# Patient Record
Sex: Male | Born: 1935
Health system: Southern US, Community
[De-identification: ages and names within clinical notes are randomized; demographics above are authoritative.]

## PROBLEM LIST (undated history)

## (undated) DIAGNOSIS — Z952 Presence of prosthetic heart valve: Secondary | ICD-10-CM

## (undated) DIAGNOSIS — M519 Unspecified thoracic, thoracolumbar and lumbosacral intervertebral disc disorder: Secondary | ICD-10-CM

## (undated) DIAGNOSIS — R011 Cardiac murmur, unspecified: Secondary | ICD-10-CM

## (undated) DIAGNOSIS — K219 Gastro-esophageal reflux disease without esophagitis: Secondary | ICD-10-CM

## (undated) DIAGNOSIS — Z8739 Personal history of other diseases of the musculoskeletal system and connective tissue: Secondary | ICD-10-CM

## (undated) DIAGNOSIS — E782 Mixed hyperlipidemia: Secondary | ICD-10-CM

## (undated) DIAGNOSIS — I10 Essential (primary) hypertension: Secondary | ICD-10-CM

## (undated) DIAGNOSIS — I4891 Unspecified atrial fibrillation: Secondary | ICD-10-CM

## (undated) DIAGNOSIS — I350 Nonrheumatic aortic (valve) stenosis: Secondary | ICD-10-CM

## (undated) DIAGNOSIS — I499 Cardiac arrhythmia, unspecified: Secondary | ICD-10-CM

## (undated) DIAGNOSIS — I2510 Atherosclerotic heart disease of native coronary artery without angina pectoris: Secondary | ICD-10-CM

## (undated) DIAGNOSIS — Z87442 Personal history of urinary calculi: Secondary | ICD-10-CM

## (undated) DIAGNOSIS — N400 Enlarged prostate without lower urinary tract symptoms: Secondary | ICD-10-CM

## (undated) HISTORY — DX: Unspecified atrial fibrillation: I48.91

## (undated) HISTORY — DX: Personal history of other diseases of the musculoskeletal system and connective tissue: Z87.39

## (undated) HISTORY — PX: TOTAL KNEE ARTHROPLASTY: SHX125

## (undated) HISTORY — PX: LUMBAR LAMINECTOMY: SHX95

## (undated) HISTORY — DX: Benign prostatic hyperplasia without lower urinary tract symptoms: N40.0

## (undated) HISTORY — DX: Gastro-esophageal reflux disease without esophagitis: K21.9

## (undated) HISTORY — DX: Mixed hyperlipidemia: E78.2

## (undated) HISTORY — DX: Unspecified thoracic, thoracolumbar and lumbosacral intervertebral disc disorder: M51.9

---

## 1998-05-26 ENCOUNTER — Ambulatory Visit (HOSPITAL_COMMUNITY): Admission: RE | Admit: 1998-05-26 | Discharge: 1998-05-26 | Payer: Self-pay | Admitting: *Deleted

## 2000-08-09 ENCOUNTER — Encounter: Payer: Self-pay | Admitting: *Deleted

## 2000-08-09 ENCOUNTER — Ambulatory Visit (HOSPITAL_COMMUNITY): Admission: RE | Admit: 2000-08-09 | Discharge: 2000-08-09 | Payer: Self-pay | Admitting: *Deleted

## 2000-10-31 ENCOUNTER — Ambulatory Visit (HOSPITAL_COMMUNITY): Admission: RE | Admit: 2000-10-31 | Discharge: 2000-10-31 | Payer: Self-pay | Admitting: *Deleted

## 2000-10-31 ENCOUNTER — Encounter: Payer: Self-pay | Admitting: *Deleted

## 2001-12-29 ENCOUNTER — Ambulatory Visit (HOSPITAL_COMMUNITY): Admission: RE | Admit: 2001-12-29 | Discharge: 2001-12-29 | Payer: Self-pay | Admitting: Neurological Surgery

## 2001-12-29 ENCOUNTER — Encounter: Payer: Self-pay | Admitting: Neurological Surgery

## 2002-10-31 ENCOUNTER — Encounter: Payer: Self-pay | Admitting: Urology

## 2002-10-31 ENCOUNTER — Encounter: Admission: RE | Admit: 2002-10-31 | Discharge: 2002-10-31 | Payer: Self-pay | Admitting: Urology

## 2002-11-05 ENCOUNTER — Encounter: Payer: Self-pay | Admitting: Urology

## 2002-11-05 ENCOUNTER — Ambulatory Visit (HOSPITAL_BASED_OUTPATIENT_CLINIC_OR_DEPARTMENT_OTHER): Admission: RE | Admit: 2002-11-05 | Discharge: 2002-11-05 | Payer: Self-pay | Admitting: Urology

## 2005-06-18 ENCOUNTER — Ambulatory Visit (HOSPITAL_COMMUNITY): Admission: RE | Admit: 2005-06-18 | Discharge: 2005-06-18 | Payer: Self-pay | Admitting: Urology

## 2005-09-17 ENCOUNTER — Encounter
Admission: RE | Admit: 2005-09-17 | Discharge: 2005-09-17 | Payer: Self-pay | Admitting: Physical Medicine and Rehabilitation

## 2005-09-20 ENCOUNTER — Encounter: Admission: RE | Admit: 2005-09-20 | Discharge: 2005-11-23 | Payer: Self-pay | Admitting: Family Medicine

## 2005-12-15 ENCOUNTER — Encounter: Admission: RE | Admit: 2005-12-15 | Discharge: 2005-12-15 | Payer: Self-pay | Admitting: Otolaryngology

## 2005-12-28 ENCOUNTER — Encounter (INDEPENDENT_AMBULATORY_CARE_PROVIDER_SITE_OTHER): Payer: Self-pay | Admitting: *Deleted

## 2005-12-28 ENCOUNTER — Ambulatory Visit (HOSPITAL_COMMUNITY): Admission: RE | Admit: 2005-12-28 | Discharge: 2005-12-28 | Payer: Self-pay | Admitting: Otolaryngology

## 2006-01-24 ENCOUNTER — Encounter: Admission: RE | Admit: 2006-01-24 | Discharge: 2006-03-02 | Payer: Self-pay | Admitting: Family Medicine

## 2006-03-23 ENCOUNTER — Inpatient Hospital Stay (HOSPITAL_BASED_OUTPATIENT_CLINIC_OR_DEPARTMENT_OTHER): Admission: RE | Admit: 2006-03-23 | Discharge: 2006-03-23 | Payer: Self-pay | Admitting: Cardiology

## 2006-11-28 ENCOUNTER — Inpatient Hospital Stay (HOSPITAL_COMMUNITY): Admission: RE | Admit: 2006-11-28 | Discharge: 2006-12-02 | Payer: Self-pay | Admitting: Orthopedic Surgery

## 2006-12-07 ENCOUNTER — Encounter: Admission: RE | Admit: 2006-12-07 | Discharge: 2006-12-07 | Payer: Self-pay | Admitting: Cardiology

## 2007-01-23 ENCOUNTER — Encounter: Admission: RE | Admit: 2007-01-23 | Discharge: 2007-02-21 | Payer: Self-pay | Admitting: Orthopedic Surgery

## 2007-01-30 ENCOUNTER — Encounter: Admission: RE | Admit: 2007-01-30 | Discharge: 2007-02-27 | Payer: Self-pay | Admitting: Family Medicine

## 2007-02-28 ENCOUNTER — Encounter: Admission: RE | Admit: 2007-02-28 | Discharge: 2007-03-21 | Payer: Self-pay | Admitting: Family Medicine

## 2007-06-07 ENCOUNTER — Encounter: Admission: RE | Admit: 2007-06-07 | Discharge: 2007-07-06 | Payer: Self-pay | Admitting: Orthopedic Surgery

## 2007-07-07 ENCOUNTER — Encounter: Admission: RE | Admit: 2007-07-07 | Discharge: 2007-08-02 | Payer: Self-pay | Admitting: Orthopedic Surgery

## 2007-08-03 ENCOUNTER — Encounter: Admission: RE | Admit: 2007-08-03 | Discharge: 2007-09-07 | Payer: Self-pay | Admitting: Orthopedic Surgery

## 2007-10-19 HISTORY — PX: EYE SURGERY: SHX253

## 2007-10-19 HISTORY — PX: CERVICAL LAMINECTOMY: SHX94

## 2007-11-13 ENCOUNTER — Inpatient Hospital Stay (HOSPITAL_COMMUNITY): Admission: RE | Admit: 2007-11-13 | Discharge: 2007-11-17 | Payer: Self-pay | Admitting: Orthopedic Surgery

## 2007-12-12 ENCOUNTER — Encounter: Admission: RE | Admit: 2007-12-12 | Discharge: 2008-01-16 | Payer: Self-pay | Admitting: Orthopedic Surgery

## 2008-01-17 ENCOUNTER — Encounter: Admission: RE | Admit: 2008-01-17 | Discharge: 2008-01-29 | Payer: Self-pay | Admitting: Orthopedic Surgery

## 2008-05-22 ENCOUNTER — Ambulatory Visit (HOSPITAL_COMMUNITY): Admission: RE | Admit: 2008-05-22 | Discharge: 2008-05-23 | Payer: Self-pay | Admitting: Neurological Surgery

## 2008-06-18 ENCOUNTER — Encounter: Admission: RE | Admit: 2008-06-18 | Discharge: 2008-06-18 | Payer: Self-pay | Admitting: Neurological Surgery

## 2008-07-17 ENCOUNTER — Ambulatory Visit (HOSPITAL_COMMUNITY): Admission: RE | Admit: 2008-07-17 | Discharge: 2008-07-17 | Payer: Self-pay | Admitting: Orthopedic Surgery

## 2008-07-30 ENCOUNTER — Encounter: Admission: RE | Admit: 2008-07-30 | Discharge: 2008-10-03 | Payer: Self-pay | Admitting: Orthopedic Surgery

## 2008-08-26 ENCOUNTER — Encounter: Admission: RE | Admit: 2008-08-26 | Discharge: 2008-08-26 | Payer: Self-pay | Admitting: Neurological Surgery

## 2008-11-13 ENCOUNTER — Encounter: Admission: RE | Admit: 2008-11-13 | Discharge: 2008-12-04 | Payer: Self-pay | Admitting: Orthopedic Surgery

## 2008-11-15 ENCOUNTER — Encounter: Admission: RE | Admit: 2008-11-15 | Discharge: 2008-11-15 | Payer: Self-pay | Admitting: Family Medicine

## 2008-12-03 ENCOUNTER — Encounter: Admission: RE | Admit: 2008-12-03 | Discharge: 2008-12-03 | Payer: Self-pay | Admitting: Neurological Surgery

## 2008-12-16 ENCOUNTER — Encounter: Admission: RE | Admit: 2008-12-16 | Discharge: 2009-02-10 | Payer: Self-pay | Admitting: Orthopedic Surgery

## 2009-10-18 HISTORY — PX: SHOULDER SURGERY: SHX246

## 2010-06-11 ENCOUNTER — Ambulatory Visit: Payer: Self-pay | Admitting: Cardiology

## 2010-06-19 ENCOUNTER — Ambulatory Visit (HOSPITAL_COMMUNITY): Admission: RE | Admit: 2010-06-19 | Discharge: 2010-06-19 | Payer: Self-pay | Admitting: Cardiology

## 2010-06-19 ENCOUNTER — Ambulatory Visit: Payer: Self-pay | Admitting: Cardiology

## 2010-06-26 ENCOUNTER — Ambulatory Visit: Payer: Self-pay | Admitting: Cardiology

## 2010-07-03 ENCOUNTER — Ambulatory Visit: Payer: Self-pay | Admitting: Cardiology

## 2010-07-23 ENCOUNTER — Ambulatory Visit: Payer: Self-pay | Admitting: Cardiology

## 2010-08-06 ENCOUNTER — Ambulatory Visit: Payer: Self-pay | Admitting: Cardiology

## 2010-11-08 ENCOUNTER — Encounter: Payer: Self-pay | Admitting: Urology

## 2011-03-02 NOTE — Op Note (Signed)
NAMECHARLES, [name]                ACCOUNT NO.:  [id number]   MEDICAL RECORD NO.:  [id number]          PATIENT TYPE:  OIB   LOCATION:  3533                         FACILITY:  [facility]   PHYSICIAN:  Tia Alert, MD     DATE OF BIRTH:  30-Jan-1926   DATE OF PROCEDURE:  05/22/2008  DATE OF DISCHARGE:                               OPERATIVE REPORT   PREOPERATIVE DIAGNOSIS:  Cervical spondylosis with cervical spinal  stenosis, C5-C6, C6-C7 with cord compression.   POSTOPERATIVE DIAGNOSIS:  Cervical spondylosis with cervical spinal  stenosis, C5-C6, C6-C7 with cord compression.   PROCEDURES:  1. Decompressive anterior cervical diskectomy, C5-C6, C6-C7 for      central canal decompression.  2. Anterior cervical arthrodesis, C5-C6, C6-C7 utilizing a 6-mm      corticocancellous allograft at C5-C6 and a 7-mm graft at C6-C7.  3. Anterior cervical plating, C5-C7 inclusive utilizing a 40-mm      Atlantis Venture plate.   SURGEON:  Tia Alert, MD   ASSISTANT:  Donalee Citrin, MD   ANESTHESIA:  General endotracheal.   COMPLICATIONS:  None apparent.   INDICATIONS FOR PROCEDURE:  John [name] is an 77 year old gentleman who was  referred with neck pain with numbness in his hand and numbness down his  right arm.  He had an MRI which showed cervical spondylosis at C3-C4, C4-  C5, C5-C6 and C6-C7.  C3-C4 had anterior subluxation of C3 on C4.  C4-C5  had some subluxation of C4 on C5. They both had degenerative disk  disease and facet arthrosis.  However, at C5-C6 and C6-C7, he had large  osteophytic ridges with disk bulges which caused significant spinal  stenosis and severe cord compression at C5-C6 and laterally severe  compression at C6-C7.  There was no cord compression at C3-C4 and C4-C5.  Because of his advanced age, I recommended 2-level anterior cervical  diskectomy and fusion plating at C5-C6 and C6-C7 trying to avoid the  bigger 4-level ACDF replating or the posterior cervical  decompression  instrumented fusion.  This would serve the purpose of decompressing his  spinal cord and hopefully prevent worsening of myelopathy.  He  understood the risks, benefits, expected outcome, and wished to proceed.   DESCRIPTION OF PROCEDURE:  The patient was taken to the operating room.  After induction of adequate generalized endotracheal anesthesia, he was  placed in supine position on the operating room table.  His right  anterior cervical region was prepped with DuraPrep and then draped in  usual sterile fashion.  Local anesthesia 3 mL was injected and a  transverse incision was made to the right of midline and carried down to  the platysma which was elevated, opened, and undermined with Metzenbaum  scissors.  I then dissected a plane medial to the sternocleidomastoid  muscle and internal carotid artery and lateral to the trachea and  esophagus to expose C5-C6 and C6-C7.  The intraoperative fluoroscopy  confirmed my level and then I bit the anterior osteophytes with the  Leksell rongeur to flatten the anterior cervical spine.  I then used the  high-speed  drill to drill the endplates at C5-C6 and C6-C7, both disk  spaces were collapsed to about a millimeter or two.  I drilled to a  height of 6 mm at C5-C6 and 7 mm at C6-C7.  I drilled down to the level  of the posterior longitudinal ligament and brought in the operating  microscope.  I used the 1-mm Kerrison punch to get up under the ligament  with a nerve hook and then remove it in a circumferential fashion  undercutting the bodies of C5 and C6.  Angling the scope up and down to  look up under the bodies as best we could, we dissected and decompressed  along the superior endplate of C6 to identify the pedicles bilaterally  and then performed foraminotomies.  The dura was pushed away.  There was  severe spondylosis and overgrown facet and spur complexes.  The spur at  C5-C6 was quite large and quite compressive.  This was  bitten away and  then removed.  We then palpated in a circumferential fashion with a  nerve hook to assure adequate decompression.  The dura was full and  capacious all the way across.  We palpated it in circumferential fashion  to assure adequate decompression with both palpation and visualization.  We felt like we had a good decompression at C5-C6.  We inspected the  films once again and made sure that we had achieved what we felt we  should achieve at C5-C6 and then moved to C6-C7 and performed the exact  same decompression opening the posterior longitudinal ligament with a  nerve hook and then removing it in a circumferential fashion with  undercutting the bodies of C6-C7.  We marched along the C7 endplate to  identify the pedicles and decompressed these into the foramina until the  C7 nerve roots were decompressed.  Again, the dura was full and  capacious and we palpated with a nerve hook in a circumferential fashion  to assure adequate decompression.  Once the decompression was completed  at both levels, we measured the interspace to be 6 mm at C5-C6 and 7 mm  at C6-C7.  We used corresponding allografts and tapped these into  position.  We then used a 40-mm Atlantis Venture plate and placed two 15-  mm variable angle screw in the bodies of C5-C6 and C6-C7 and these were  locked into the plate by locking mechanism within the plate.  We then  spent considerable time drying the surgical bed with bipolar cautery,  surgery foam, and then irrigated all of this clear.  Once meticulous  hemostasis was achieved, closed the platysma with 3-0 Vicryl, closed  subcuticular tissue with 3-0 Vicryl, and closed the skin with Benzoin  and Steri-Strips.  The drapes were removed.  A sterile dressing was  applied.  The patient was awakened from general anesthesia and  transferred to recovery room in stable condition.  At the end of the  procedure all sponge, needle, and instrument counts were  correct.      Tia Alert, MD  Electronically Signed     DSJ/MEDQ  D:  05/22/2008  T:  05/23/2008  Job:  [id number]

## 2011-03-02 NOTE — Discharge Summary (Signed)
John John [name], John John [name]                ACCOUNT NO.:  [id number]   MEDICAL RECORD NO.:  [id number]          PATIENT TYPE:  INP   LOCATION:  5023                         FACILITY:  [facility]   PHYSICIAN:  Mila Homer. Sherlean John [name], M.D. DATE OF BIRTH:  1926-05-26   DATE OF ADMISSION:  11/13/2007  DATE OF DISCHARGE:  11/17/2007                               DISCHARGE SUMMARY   ADMISSION DIAGNOSES:  1. End-stage osteoarthritis left knee status post right knee      replacement.  2. Aortic stenosis.  3. Left John [name] drop status post lumbar surgery.  4. History of kidney stones.   DISCHARGE DIAGNOSES:  1. End-stage osteoarthritis left knee status post left total knee      arthroplasty.  2. Acute blood loss anemia secondary to surgery.  3. Hyponatremia.  4. Possible hematemesis.  5. Hypertension.  6. Aortic stenosis.  7. History of left John [name] drop status post lumbar surgery.  8. History of kidney stones.   SURGICAL PROCEDURES:  On November 13, 2007, John John [name] underwent a left  total knee arthroplasty by Dr. Mila Homer. Lucey assisted by Jacqualine Code, P.A.-C.  He had a NexGen all poly patella size 35 9-mm  thickness placed with a NexGen fluted stem tibial component size 6.  A  NexGen Legacy LPS articular surface size green EF 14 mm height with a  NexGen Legacy knee LPS femoral component size F left.   COMPLICATIONS:  None.   CONSULTANT:  1. Physical therapy case management consult, November 14, 2007.  2. Cardiology consult by Dr. Deborah Chalk, November 14, 2007.   HISTORY OF PRESENT ILLNESS:  This 77 year old white male patient  presented to [name] with a history of a right knee replacement in  February 2008 that has done very well.  He has had a 3-year history of  gradual onset progressive left knee pain.  Pain is an intermittent sharp  ache over the anterior joint without radiation.  It increases with  activity and decreases with rest.  The knee grinds.  He has failed  conservative treatment.  X-rays  show end-stage arthritic changes.  Because of this he is presenting for a left knee replacement.   The patient underwent surgical procedure well without immediate  postoperative complications.  He was transferred to 5000.  On postop day  #1 he did have some nausea and vomiting, it was a bit coffee-ground.  Hemoglobin was 8.8, hematocrit 25.1, BP was low at 98/54.  He was  started on IV Protonix.  Cardiac workup was started.  Celebrex was  discontinued and Dr. Deborah Chalk was consulted to follow him and he followed  him the rest of his hospitalization.   On postop day #2, nausea had resolved.  There was no more hematemesis.  Hemoglobin was 8.8, hematocrit 25.7, BP still low at 98/50.  Hemovac and  Marcaine pump were discontinued.  Dressing was changed to the knee.  He  was transfused 1 unit of packed red blood cells.  He was switched to  p.o. pain meds and continued on therapy.   On postop day #3 he felt  pretty good.  Dizziness had resolved.  Hemoglobin was 8.3, hematocrit 24.1, BP improved to 126/71.  Leg was  neurovascularly intact.  His hyponatremia had resolved.  He was  continued on therapy and it was felt he would be ready for discharge  home in the next day or so.   On January 30th, hemoglobin was 9.4, hematocrit 27.  He had received a  transfusion the day before.  BP was 110/58.  Stool was guaiac negative.  He was doing well enough with therapy that it was felt he was ready for  discharge home.  Cardiology agreed and he was discharged home later that  day.   DISCHARGE INSTRUCTIONS:   DIET:  He can resume his regular prehospitalization diet.   MEDICATIONS:  He may resume his home meds as follows:  1. C complex 1500 mg 1 p.o. b.i.d.  2. Omega Q plus fish 1 tablet p.o. b.i.d.    The rest of his meds which are mostly nutritional supplements are to be  held at this time.  They are Joint Advantage Gold, Gold beta-carotene,  pantothenic acids, selenium, vitamin E, post Maxum plus,  zinc, liver  culate, Zuspan, and chelated magnesium.   ADDITIONAL MEDS:  At this time include:  1. Norco 5/325, 1-to-2 p.o. every 4 hours p.r.n. for pain, 60 with no      refill.  2. Robaxin 500 mg, 1 tablet p.o. every 6 hours p.r.n. for spasms, 40      with no refill.  3. Lovenox 40 mg subcu at 8 a.m., 10 with no refill.   ACTIVITY:  He can be out of bed weightbearing as tolerated on the left  leg with use of a walker.  He is to have home CPM zero to 90 degrees 6-  to-8 hours a day and home health PT per [place].  He may increase his  activity slowly and no lifting or driving for 6 weeks.  Please see the  blue total knee discharge sheet for further activity instructions.   WOUND CARE:  He can keep his incision clean and dry.  Please see the  blue total knee discharge sheet for further wound care instructions.   FOLLOWUP:  1. He is to follow up with John John [name] in our office on February 10th      and needs to call [phone] for that appointment.  2. He is to follow up with Dr. Deborah Chalk in his office in 2-to-3 weeks      and needs to call [phone] for that appointment.   LABORATORY DATA:  Chest x-ray done on January 27 showed mild bibasilar  atelectasis.  No focal infiltrates.   Hemoglobin/hematocrit ranged from 15.1 and 44.2 on the 20th to 8.3 and  24.1 on the 29th to 9.4 and 27 on the 30th.  Platelets went from 240 on  the 20th to 136 on the 28th to 186 on the 30th.  Occult blood gastric pH  on January 27 was positive, on the 28th it was negative.   Sodium went from 138 on the 20th to 131 on the 27th to 137 on the 30th.  Potassium went from 4.6 on the 20th to 3.4 on the 29th to 4 on the 30th.  Glucose ranged from 94 on the 20th to 119 on the 29th to 109 on the  30th.  Calcium ranged from 7.9 on the 27th to 8.1 on the 28th and 8.3 on  the 30th.   Cardiac enzymes done on January  27th showed a CK of 142, CK-MB 4.5,  index of 3.2, and troponin 0.1.  All other cardiac enzymes were   negative.   UA done on January 20th showed a small amount of leukocyte esterase,  negative nitrates, 3 to 6 white cells, zero to 2 red cells.  All other  laboratory studies were within normal limits.      Legrand Pitts Duffy, P.A.    ______________________________  Mila Homer. Sherlean John [name], M.D.    KED/MEDQ  D:  12/04/2007  T:  12/05/2007  Job:  [id number]   cc:   Colleen Can. Deborah Chalk, M.D.

## 2011-03-02 NOTE — Consult Note (Signed)
John [name], John [name]                ACCOUNT NO.:  [id number]   MEDICAL RECORD NO.:  [id number]          PATIENT TYPE:  INP   LOCATION:  5023                         FACILITY:  [facility]   PHYSICIAN:  Colleen Can. Deborah Chalk, M.D.DATE OF BIRTH:  [birth year]   DATE OF CONSULTATION:  11/14/2007  DATE OF DISCHARGE:                                 CONSULTATION   HISTORY:  Thank you much for asking me to see John [name].  He is an 77-  year-old male with known aortic stenosis.  He is now in for total knee  replacement.  He apparently vomited coffee-ground material and was  nauseated last night.  He received 1 unit of blood after blood pressure  being low in 90/50 range.  Overall, he is feeling better but still has  an EKG showing frequent PAC.   PAST MEDICAL HISTORY:  Remarkable for aortic stenosis.  The last  echocardiogram in January 2009, showing an aortic valve area of 0.9,  peak gradient was 66 mm by echocardiogram.  However cardiac  catheterization in June 2007, showed only mild coronary atherosclerosis  with mean aortic valve gradient of 11 mmHg and aortic valve area 1.5 cm  squared.  It was at that time that he had his first knee replacement.  He has had hypercholesterolemia, history of PACs, PVCs, osteoarthritis,  pneumonia and history impotence.  He had remote back surgery 1997, and  previous kidney stones.   ALLERGIES:  PENICILLIN and CEPHALOSPORINS.   CURRENT MEDICATIONS:  None routine.   FAMILY HISTORY:  Father died 74 with hypertension, cerebral hemorrhage.  Mother died at age 39.  There are no siblings.   SOCIAL HISTORY:  Married.  He is active in [employer], still  working.  No tobacco.  He has social alcohol.  He exercises regularly.   REVIEW OF SYSTEMS:  Essentially unremarkable.  There has been no chest  pain, shortness of breath or syncope.  He has been asymptomatic from his  aortic valvular disease.   PHYSICAL EXAMINATION:  GENERAL:  He is alert in no acute distress.  VITAL SIGNS:  Blood pressure is 98/54, heart rate 79.  SKIN:  [place].  Color is dry.  LUNGS:  Reasonably clear.  HEART:  Shows a grade 3/6 systolic ejection murmur with PACs.  ABDOMEN:  Soft.  EXTREMITIES:  Without edema.  He has a left total knee replacement.   LABORATORY DATA:  Hematocrit is 25, white count is 10.  Gastric emesis  positive for blood.  BUN 16, creatinine 0.8, glucose of 149.   IMPRESSION:  1. Status post total knee replacement.  2. Known aortic stenosis.  3. Anemia.  4. Premature atrial contractions.  5. History of positive gastric emesis.   PLAN:  We will check lab, watch for electrolytes.  We will try to avoid  anemia, transfuse to keep his hematocrit in the 28-30 range.  We will  continue evaluate his GI.  If anemia persists or if he has positive  stools, we will need to evaluate his GI tract for source of bleeding.      Colleen Can.  Deborah Chalk, M.D.  Electronically Signed     SNT/MEDQ  D:  11/15/2007  T:  11/15/2007  Job:  [id number]   cc:   Mila Homer. Sherlean Foot, M.D.

## 2011-03-02 NOTE — Op Note (Signed)
[name], John [name]                ACCOUNT NO.:  [id number]   MEDICAL RECORD NO.:  [id number]          PATIENT TYPE:  INP   LOCATION:  5023                         FACILITY:  [facility]   PHYSICIAN:  Mila Homer. Sherlean Foot, M.D. DATE OF BIRTH:  [birth year]   DATE OF PROCEDURE:  11/13/2007  DATE OF DISCHARGE:                               OPERATIVE REPORT   SURGEON:  Mila Homer. Sherlean Foot, M.D.   ASSISTANT:  Jacqualine Code, PA.   ANESTHESIA:  General.   PREOPERATIVE DIAGNOSIS:  Left knee osteoarthritis.   POSTOPERATIVE DIAGNOSIS:  Left knee osteoarthritis.   PROCEDURE:  Left total knee arthroplasty.   INDICATIONS FOR PROCEDURE:  The patient is an 77 year old white male  with failure of conservative measures for osteoarthritis of the left  knee.  Informed consent was obtained.   DESCRIPTION OF PROCEDURE:  The patient was laid supine and administered  general anesthesia.  He was then placed in the supine position with  Foley catheter placed.  The left leg was prepped and draped in the usual  sterile fashion.  The extremity was exsanguinated with the Esmarch  tourniquet inflated to 350 mmHg.  A midline incision was made with a #10  blade.  New blade was used to make a medial parapatellar arthrotomy and  perform a synovectomy.  I then elevated the deep MCL off the medial  crest of the tibia.  I everted the patella and measured 22 mm thick.  I  reamed down 9 mm and drilled three lug holes with the trial and  recreated the 22 mm thickness with the prosthetic trial in place.  I  then went into flexion.  I used the extramedullary alignment system on  the tibia and the intramedullary alignment system on the femur.  Then  sized to a size F pinned through the 3 degree external rotation holes.  I then placed a lamina spreader in the knee and removed the medial and  lateral menisci and posterior condylar osteophytes ACL and PCL.  I then  placed a 10 mm spacer block in the knee, had a lot more medial  tightness  and I had to release the entire MCL and hamstrings.  At this point I  used a 14 insert.  I then finished the femur with a size F finishing  block, finished the tibia with a size 6 tibial tray drilling keel.  I  then trialed with F femur, 6 tibia, 14 insert and 35 patella.  Had good  flexion/extension gap balance dropping angle back to 125 degrees.  Then  removed trial and copiously irrigated.  Then cemented in the components,  removed excess cement.  I irrigated again, let the tourniquet down when  the cement was hard.  Then closed the arthrotomy figure-of-eight #1  Vicryl sutures, deep soft tissues with buried 0 Vicryl sutures,  subcuticular 3-0 Vicryl stitches and staples.  I delivered a Hemovac  advanced superolaterally deep arthrotomy and pain catheter coming out  superomedially superficial to the arthrotomy.  Closed the skin with  staples.  Dressed with Xeroform dressing sponges, sterile Webril and TED  stocking.   COMPLICATIONS:  None.   DRAINS:  One Hemovac, one pain catheter.   ESTIMATED BLOOD LOSS:  300 mL.   TOURNIQUET TIME:  1 hour 3 minutes.           ______________________________  Mila Homer Sherlean Foot, M.D.     SDL/MEDQ  D:  11/16/2007  T:  11/17/2007  Job:  [id number]

## 2011-03-05 NOTE — Discharge Summary (Signed)
John [name], John [name]                ACCOUNT NO.:  [id number]   MEDICAL RECORD NO.:  [id number]          PATIENT TYPE:  INP   LOCATION:  5030                         FACILITY:  [facility]   PHYSICIAN:  Mila Homer. Sherlean Foot, M.D. DATE OF BIRTH:  Mar 22, 1926   DATE OF ADMISSION:  11/28/2006  DATE OF DISCHARGE:  12/02/2006                               DISCHARGE SUMMARY   ADMISSION DIAGNOSES:  1. End-stage osteoarthritis right knee.  2. Aortic stenosis.   DISCHARGE DIAGNOSES:  1. End-stage osteoarthritis right knee status post right total knee      arthroplasty.  2. Acute blood loss anemia secondary to surgery.  3. Nausea secondary to medications, now resolved.  4. Hyperkalemia.  5. Constipation.  6. Pruritus secondary to medications.  7. Right buttock bruise/contusion.  8. Aortic stenosis.   SURGICAL PROCEDURES:  On November 28, 2006, John [name] underwent a right  total knee arthroplasty by Dr. Mila Homer.  Lucey, assisted by Rexene Edison,  PA-C.  He had a NexGen Legacy knee femoral component size F right placed  with a NexGen fluted stem tibial component size 5.  An articular surface  size green EF 12 mm height with an all poly patella standard size 35 9-  mm thickness.   COMPLICATIONS:  None.   CONSULTS:  1. Physical therapy consult November 29, 2006.  2. Occupational therapy consult December 02, 2006.  3. Case management consult November 28, 2006.   HISTORY OF PRESENT ILLNESS:  A 77 year old white male patient presented  to Dr. Sherlean Foot with history of severely arthritic right knee.  It is now  hurting constantly.  It is kind of moderately-severe sharp pain with  persistent swelling.  It is worsening at this time but it does not  awaken him at night.  It is worse with exercise, better with rest, heat  and ice.  He has failed conservative treatment and because of that he is  presenting for a right knee replacement.   HOSPITAL COURSE:  John [name] tolerated his surgical procedure well  without  immediate postoperative complications.  He was transferred to 5000.  On  postoperative day #1 he was having good pain control but having a lot of  nausea and vomiting.  His blood pressure was a bit low at 90/66.  Hemoglobin/hematocrit were stable.  Leg was neurovascularly intact.  His  PCA was switched at that time.  He was started on therapy and the nausea  and vomiting was monitored.   Postoperative day #2, nausea and vomiting had resolved bit his appetite  was poor.  He had made slow progress with therapy.  Blood pressure  improved to 107/71.  Hemoglobin was 9.2, hematocrit 27.6.  Leg was  neurovascularly intact.  Dressing was changed, drains were discontinued.  His potassium was high at 5.2 and that was monitored and rechecked.  He  was continued on therapy.   On postoperative day #3, he was having difficulty with his appetite.  Pain was fairly well controlled but hemoglobin was low at 8 with  hematocrit of 22.6.  Pulse was 99, BP was 101/68.  On recheck,  hemoglobin was 7.8 and he was transfused with 2 units of packed red  blood cells.  He tolerated that well.  His pain medicines were switched  because he was having some itching with the Percocet.  Treatment was  given for constipation and he was continued on therapy.   On February 15 he was feeling better.  He had had a bowel movement.  He  was having some difficulty with a sore or contusion on his buttock area  that appeared to be a shallow bruise.  He was voiding without  difficulty.  Hemoglobin had improved to 9.3 with hematocrit of 26.4.  He  was tolerating diet at that point and leg was neurovascularly intact.  He was doing well enough with therapy that was felt he was ready for  discharge home.  He was discharged home at that time.   DISCHARGE INSTRUCTIONS:  Diet:  He can resume his regular  prehospitalization diet.   Medications:  He may resume his prehospitalization medications.  These  include:  1.  __________  two tablets p.o. q.a.m.  2. __________  two tablets p.o. q.a.m.Marland Kitchen  3. Prilosec OTC half a tablet p.o. q.a.m.   Additional medications at this time include:  1. Lovenox 40 mg subcu daily at 8 a.m. with the last dose on December 12, 2006.  2. Norco 5/325 mg one to two tablets p.o. q.4h. p.r.n. for pain, #60      with no refill.  3. Robaxin 500 mg one to two tablets p.o. q.6h. p.r.n. for spasms, #40      with no refill.  4. He is encouraged to take an iron supplement twice a day for 1      month.   Activity:  He can be out of bed weightbearing as tolerated on the right  leg with the use of the walker.  He is to have home CPM 0-100 6-8 hours  a day and home health PT per [facility].  Please see the  blue total knee discharge sheet for further activity instructions.   Wound care:  He may shower after no drainage from the wound for 2 days.  Please see the blue total knee discharge sheet for further wound care  instructions.   Follow-up:  He needs to follow up with Dr. Sherlean Foot in our office on  December 13, 2006, and needs to call [phone] for that appointment.   LABORATORY DATA:  Hemoglobin/hematocrit ranged from 14.5 and 42.4 on the  5th, to 7.8 and 22 on the 14th, to 9.3 and 26.4 on the 15th.  White  count ranged from 6.9 on the 5th, to 12.6 on the 12th, to 7.2 on the  15th.  Platelets remained within normal limits.   Sodium dropped to a low of 131 on the 13th and was 133 on the 14th.  Potassium went to a high of 5.2 on the 13th; on recheck it was 4.3.   Glucose ranged from 93 on the 5th to high of 147 on the 13th to 131 on  the 14th.   All other laboratory studies were within normal limits.      John [name], P.A.    ______________________________  Mila Homer. Sherlean Foot, M.D.    KED/MEDQ  D:  01/16/2007  T:  01/16/2007  Job:  [id number]

## 2011-03-05 NOTE — Op Note (Signed)
John [name], John [name]                ACCOUNT NO.:  [id number]   MEDICAL RECORD NO.:  [id number]          PATIENT TYPE:  INP   LOCATION:  2899                         FACILITY:  [facility]   PHYSICIAN:  Mila Homer. Sherlean Foot, M.D. DATE OF BIRTH:  08/30/1926   DATE OF PROCEDURE:  11/28/2006  DATE OF DISCHARGE:                               OPERATIVE REPORT   SURGEON:  Mila Homer. Sherlean Foot, M.D.   ASSISTANT:  Rexene Edison, PA   ANESTHESIA:  General.   PREOPERATIVE DIAGNOSIS:  Right knee osteoarthritis.   POSTOPERATIVE DIAGNOSIS:  Right knee osteoarthritis.   PROCEDURE:  Right total knee replacement.   INDICATIONS FOR PROCEDURE:  The patient is a 77 year old white male with  failure of conservative measures for osteoarthritis of the knee.  Informed consent was obtained.   DESCRIPTION OF PROCEDURE:  The patient was laid supine, administered  general anesthesia and then both the right leg was prepped and draped in  usual sterile fashion.  Foley catheter was placed prior to sterile prep  and drape.  After the base of the extremity was exsanguinated with the  Esmarch and tourniquet inflated to 325 mmHg, the midline incision was  made with a #10 blade approximately 6 inches in length.  At this point I  used a fresh blade to perform a medial parapatellar arthrotomy and  perform a synovectomy.  I then everted the patella.  The patella  measured 23 mm thick.  After reaming down and placing three lug holes, I  put the trial in place and also measured 23 mm.  At this point I then  went into flexion and used the extramedullary alignment system to make a  perpendicular cut to the anatomic axis of the tibia removing 2 mm of  bone off the medial side.  At this point I then removed that guide and  the cut surface of the bone and made an intramedullary hole in the  femur.  I used an intramedullary guide set on 6 degree valgus angle and  made the distal femoral cut with a sagittal saw.  Drilled out the  epicondylar axis, posterior condylar angle measured 3 degrees.  Sized to  a size F into the 3 degrees external rotation holes.  I then made the  anterior, posterior and chamfer cuts with the sagittal saw.  I then  placed a lamina spreader in the knee and removed the medial and lateral  menisci, ACL, PCL, posterior condylar osteophytes.  Placed a 10-mm  spacer block in place got a little bit of tightness along the  medial  side. I released the semimembranosus at its attachment on the  posteromedial tibia and some of the distal MCL as well.  I then put  scoop retractor behind the femur, finished the femur with a size F  finishing block, drilling for the lowest cutting through the box.  I  finished the tibia with a size 5 tibial tray drilling keel.  I then  trialed, a 12-mm spacer block afforded excellent flexion/extension gap  balance.  I then removed the trials, copiously irrigated and cemented  components,  removing excess cement, letting the cement harden in  extension.  I then placed a Hemovac coming out superolaterally and deep  to the arthrotomy, pain catheter coming out supermedial and superficial  to the arthrotomy.  I closed the arthrotomy with interrupted figure-of-  eight Vicryl sutures, deep soft tissues with buried 0 Vicryl sutures,  subcuticular 2-0 Vicryl stitch and skin staples.   COMPLICATIONS:  None.   DRAINS:  One Hemovac, one pain catheter.   TOURNIQUET TIME:  1 hour 19 minutes.           ______________________________  Mila Homer Sherlean Foot, M.D.     SDL/MEDQ  D:  11/28/2006  T:  11/28/2006  Job:  [id number]

## 2011-03-05 NOTE — Cardiovascular Report (Signed)
NAMEMAKAEL, STEIN                ACCOUNT NO.:  [id number]   MEDICAL RECORD NO.:  [id number]          PATIENT TYPE:  OIB   LOCATION:  1961                         FACILITY:  [facility]   PHYSICIAN:  Colleen Can. Deborah Chalk, M.D.DATE OF BIRTH:  17-Jul-1926   DATE OF PROCEDURE:  03/23/2006  DATE OF DISCHARGE:                              CARDIAC CATHETERIZATION   HISTORY:  Mr. Renard Matter is a 77 year old male with known asymptomatic, calcific,  aortic stenosis.  He is being evaluated for the possibility of knee surgery.  He had a 40 mm gradient noted by echo.  He is an active 77 year old.   PROCEDURE:  Right and left heart catheterization with selective coronary  angiography, left ventricular angiography, aortic root angiography and right  heart catheterization.   TYPE AND SITE OF ENTRY:  Percutaneous right femoral artery, percutaneous  right femoral vein.   CATHETERS:  A 4-French, 4-curved Judkins right-and-left coronary catheters,  a 4-French pigtail ventriculographic catheter, a 7-French Swan-Ganz  thermodilution catheter.   CONTRAST MATERIAL:  Omnipaque.   MEDICATIONS GIVEN PRIOR TO PROCEDURE:  Valium 5 mg P.O.   MEDICATIONS GIVEN DURING PROCEDURE:  None.   COMMENTS:  The patient tolerated the procedure well.   HEMODYNAMIC DATA:  The right atrial pressure showed an A wave of 5, V wave  of 4, mean of 3.  RV was of 22 /1-5, pulmonary artery was 21/7, pulmonary  cap wedge pressure showed an A wave of 9, V wave of 7, mean of 6.  Aortic  pressure was 107/64, LV was 118/5-12.  On pullback, there was a 16 mm peak-  to-peak gradient with a mean aortic valve gradient of 11 mmHg.  Aortic valve  area calculated to be greater than 1.5 cm.  The Fick cardiac output was 3.9  with thermodilution cardiac output of 4.3.   ANGIOGRAPHIC DATA:  1.  The left ventricular angiogram was performed in RAO position.  Overall      cardiac size and silhouette are normal.  The global ejection fraction      estimated  be 60%.  There is no mitral regurgitation noted.  2.  The aortic root was performed using 30 mL of contrast at 20 mL second.      There was very minimal aortic insufficiency.  The aortic valve was      calcified.  There was calcification of the coronary arteries as well.   I. LEFT MAIN CORONARY ARTERY:  The left main coronary artery is essentially  normal.   II. LEFT CIRCUMFLEX LEFT:  The left circumflex is a large system.  It has  minor irregularities.  There is a high obtuse marginal and a large  continuation branch in the inferior lateral wall, as well as a large  continuation branch in the AV groove.  There are only minor irregularities  in the left circumflex.   III. LEFT ANTERIOR DESCENDING:  Left anterior descending is a moderate size  vessel but it does end before the apex.  The first diagonal vessel has a 50-  60% narrowing; and the second diagonal vessel has a 50-60%  narrowing.  Both  of these a relatively small vessels and the narrowings do not appear to be  hemodynamically significant at this point time.   IV. RIGHT CORONARY ARTERY:  The right coronary artery is calcified  proximally and it is ectatic graft.  There is 30-50% narrowing before and  after the area of ectasia, but this may be in part, artificial, because of  the apparent dilatation of the vessel in between these possibly normal  areas.  There does not appear to be any significant focal obstructive  disease.   OVERALL IMPRESSION:  1.  Mild aortic stenosis.  2.  Normal left ventricular function.  3.  No aortic insufficiency.  4.  Mild coronary atherosclerosis with 30-60% narrowings in the diagonal      systems of the left anterior descending and ectasia and 30-50% narrowing      in the right coronary artery.   DISCUSSION:  These findings are better than what I expected given the  noninvasive evaluation.  It is felt that Mr. Warn is a satisfactory  operative candidate for knee surgery should he desire that,  at this point  time; and then we can begin to follow him in an ongoing fashion for his  aortic stenosis.  He does have calcific aortic stenosis, and one would  expect it to progress, and need operative intervention within the next 5  years.      Colleen Can. Deborah Chalk, M.D.  Electronically Signed     SNT/MEDQ  D:  03/23/2006  T:  03/23/2006  Job:  [id number]

## 2011-03-05 NOTE — H&P (Signed)
John [name], John [name]                ACCOUNT NO.:  [id number]   MEDICAL RECORD NO.:  [id number]           PATIENT TYPE:   LOCATION:                               FACILITY:  [facility]   PHYSICIAN:  Colleen Can. Deborah Chalk, M.D.DATE OF BIRTH:  [birth year]   DATE OF ADMISSION:  03/23/2006  DATE OF DISCHARGE:                                HISTORY & PHYSICAL   CHIEF COMPLAINT:  None.   HISTORY OF PRESENT ILLNESS:  Mr. Gosnell is a 77 year old white male.  He has  asymptomatic aortic stenosis.  He has had previous sinus surgery in March,  2007, which he tolerated very well.  He has been planning to undertaken  total knee replacement.  The patient does have a known history of  asymptomatic aortic stenosis.  He has had 2D echocardiogram in June, 2007,  which showed an ejection fraction of 55-60%.  He was felt to have calcified,  moderately severe aortic stenosis with aortic valve area of 0.83, peak  gradient of 43 with a mean gradient of 28.  He now presents for elective  left and right heart catheterization with plans for aortic valve replacement  prior to proceeding on with further orthopedic procedures.  Clinically, he  has been asymptomatic.  He denies chest pain, shortness of breath,  lightheadedness, dizziness, or syncope.   PAST MEDICAL HISTORY:  1.  Recent sinus surgery by Dr. Narda Bonds, March, 2007.  2.  Remote history of cystoscopy in 1948.  3.  History of back surgery in 1997.  4.  He had an allergic drug reaction in 1997.  5.  Remote history of kidney stones.   He denies a history of hypertension, stroke, previous heart attack, or  cancer.   ALLERGIES:  CEFTIN.   CURRENT MEDICATIONS:  He takes a multitude of vitamins.  He is on no regular  prescription meds.  His vitamins include EEC, beta carotene, garlic, Levsin,  flaxseed oil, magnesium, potassium, multivitamin, bee pollen, and  pantothenic acid.   FAMILY HISTORY:  His father died at 51 with questionable hypertension and a  cerebral hemorrhage.  Mother died at the age of 30.  He has no siblings.   SOCIAL HISTORY:  He is married.  He uses social alcohol.  He does not smoke.  He tries to exercise regularly.  He was previously employed in [employer].   REVIEW OF SYSTEMS:  He has had no recent fever, flu, or cough.  No abdominal  pain.  No constipation or diarrhea.  He treats his allergies and reflux  problems with herbal remedies.  He has had no complaints of edema and no  cardinal symptoms related to aortic stenosis.   PHYSICAL EXAMINATION:  VITAL SIGNS:  Blood pressure 104/60, heart rate 74  and regular.  Respirations are 18.  He is afebrile.  Weight is 164 pounds.  GENERAL:  He is a pleasant, very talkative white male.  He is currently in  no acute distress.  SKIN:  Pinho and dry.  Color is unremarkable.  LUNGS:  Basically clear.  CARDIAC:  A grade 3/6 late-peaking  systolic ejection murmur. :  ABDOMEN:  Soft.  Positive bowel sounds.  Nontender.  EXTREMITIES:  Without edema.  NEUROLOGIC:  Intact.  There are no gross focal deficits.   Pertinent labs are pending.   IMPRESSION:  Asymptomatic aortic stenosis.   PLAN:  We will proceed on with left and right heart catheterization.  The  procedure has been reviewed in full detail, and he is willing to proceed on  Wednesday, March 23, 2006.      Sharlee Blew, N.P.      Colleen Can. Deborah Chalk, M.D.  Electronically Signed    LC/MEDQ  D:  03/21/2006  T:  03/21/2006  Job:  [id number]   cc:   C. Duane Lope, M.D.  Fax: [phone]

## 2011-03-05 NOTE — Op Note (Signed)
NAME:  GHALI, MORISSETTE                ACCOUNT NO.:  [id number]   MEDICAL RECORD NO.:  [id number]          PATIENT TYPE:  AMB   LOCATION:  SDS                          FACILITY:  [facility]   PHYSICIAN:  Christopher E. Ezzard Standing, M.D.DATE OF BIRTH:  06-Aug-1926   DATE OF PROCEDURE:  12/28/2005  DATE OF DISCHARGE:  12/28/2005                                 OPERATIVE REPORT   PREOPERATIVE DIAGNOSIS:  Sinonasal polyps, right side worse than left with  nasal obstruction.   POSTOPERATIVE DIAGNOSIS:  Sinonasal polyps, right side worse than left with  nasal obstruction.   OPERATION PERFORMED:  Functional endoscopic sinus surgery with right  ethmoidectomy with removal of polyps, right maxillary ostial enlargement  with removal of polyps.  Left nasal endoscopy.   SURGEON:  Kristine Garbe. Ezzard Standing, M.D.   ANESTHESIA:  MAC.   COMPLICATIONS:  None.   INDICATIONS FOR PROCEDURE:  Duquan Gillooly is a 77 year old gentleman who has had  a long history of sinonasal polyps.  He has had previous surgery [place] poly[place]  [place]  Last surgery was 12 years ago.  He  also has a history of systolic murmur with aortic stenosis followed by Dr.  Deborah Chalk.  He has had increasing right-sided nasal obstruction with polyps  visualized on the right side.  CT scan shows bilateral sinonasal polyps,  right side worse than left.  He was taken to the operating room at this time  for endoscopic sinus surgery with removal of polyps under MAC anesthesia.   DESCRIPTION OF PROCEDURE:  The patient was brought to the operating room,  nose was prepped with cotton pledgets soaked in a 4% cocaine solution and  the nose was injected with Xylocaine with epinephrine for local anesthetic  and hemostasis.  First the right side was approached with 0 endoscope.  There were several polyps in the posterior ethmoid region as well as some  extending from the medial portion of the middle turbinate from the sphenoid  region.  Using up grasper and straight through cup forceps, the polyps were  removed from the ethmoid area as well as around the sphenoid sinus ostia.  Suction cautery was used for hemostasis.  Following this, a few polyps were  removed from the right maxillary ostia.  All of this was done on the right  side.  Again suction cautery was used for hemostasis.  There were also some  polyps from the anterior right ethmoid area which were removed with up cup  forceps.  After obtaining adequate hemostasis with the cautery, nose was  examined and was clear of any further disease.  There was some polypoid  tissue within the maxillary sinus but the ostia was widely patent and there  was no obstruction or build up in the sinus.  This completed the right side.  At the end, the posterior portion of the inferior turbinate was cauterized  with suction cautery.  Next, the left side was approached with the 0  endoscopes.  On the left side, there was essentially no obstructing polypoid  tissue, some slight polypoid changes within the  ethmoid area but this was  not obstructing and the patient really had no symptoms on the left side and  after doing endoscopy, nothing was done on the left side.  This completed  the procedure.  Weyman Croon was transferred to recovery room postoperatively doing  well.   DISPOSITION:  No packing was required.  Of note, Stan received 2 gm of  ampicillin preoperatively.  Weyman Croon was discharged to home later this morning  on Tylenol and Tylenol #3 as needed for pain.  No packing was required.  Patient will follow up in my office in one week for recheck.           ______________________________  Kristine Garbe. Ezzard Standing, M.D.     CEN/MEDQ  D:  12/28/2005  T:  12/29/2005  Job:  [id number]   cc:   Colleen Can. Deborah Chalk, M.D.  Fax: [phone]

## 2011-07-08 LAB — CROSSMATCH: Donor AG Type: NEGATIVE

## 2011-07-08 LAB — CBC
HCT: 25.1 — ABNORMAL LOW
HCT: 26.7 — ABNORMAL LOW
HCT: 27 — ABNORMAL LOW
HCT: 44.2
Hemoglobin: 8.8 — ABNORMAL LOW
Hemoglobin: 9.3 — ABNORMAL LOW
MCHC: 34.9
MCHC: 35
MCV: 89.8
MCV: 89.9
MCV: 90.7
Platelets: 156
Platelets: 186
Platelets: 240
RBC: 2.68 — ABNORMAL LOW
RBC: 2.82 — ABNORMAL LOW
RBC: 4.84
RDW: 14.1
RDW: 14.1
RDW: 14.2
WBC: 6.1
WBC: 7.6
WBC: 8.8

## 2011-07-08 LAB — BASIC METABOLIC PANEL
BUN: 13
BUN: 16
CO2: 28
CO2: 28
CO2: 32
Calcium: 8.1 — ABNORMAL LOW
Chloride: 100
Chloride: 101
Chloride: 101
Creatinine, Ser: 0.7
Creatinine, Ser: 0.81
Creatinine, Ser: 0.82
GFR calc Af Amer: >60
GFR calc non Af Amer: >60
Glucose, Bld: 109 — ABNORMAL HIGH
Glucose, Bld: 139 — ABNORMAL HIGH
Potassium: 3.4 — ABNORMAL LOW
Potassium: 4

## 2011-07-08 LAB — COMPREHENSIVE METABOLIC PANEL
ALT: 13
AST: 25
CO2: 29
Chloride: 103
Creatinine, Ser: 0.92
GFR calc non Af Amer: >60
Glucose, Bld: 94
Sodium: 138
Total Bilirubin: 0.7

## 2011-07-08 LAB — DIFFERENTIAL
Basophils Absolute: 0
Basophils Relative: 1
Eosinophils Relative: 7 — ABNORMAL HIGH
Monocytes Absolute: 0.6

## 2011-07-08 LAB — URINE CULTURE
Colony Count: NO GROWTH
Culture: NO GROWTH

## 2011-07-08 LAB — URINALYSIS, ROUTINE W REFLEX MICROSCOPIC
Bilirubin Urine: NEGATIVE
Glucose, UA: NEGATIVE
Hgb urine dipstick: NEGATIVE
Ketones, ur: NEGATIVE
pH: 6.5

## 2011-07-08 LAB — CARDIAC PANEL(CRET KIN+CKTOT+MB+TROPI)
CK, MB: 3.2
Relative Index: 2.5
Total CK: 126
Total CK: 80

## 2011-07-08 LAB — PREPARE RBC (CROSSMATCH)

## 2011-07-08 LAB — OCCULT BLOOD X 1 CARD TO LAB, STOOL: Fecal Occult Bld: NEGATIVE

## 2011-07-16 LAB — DIFFERENTIAL
Basophils Absolute: 0
Lymphocytes Relative: 14
Lymphs Abs: 1
Neutrophils Relative %: 71

## 2011-07-16 LAB — CBC
Platelets: 233
RDW: 14.8
WBC: 7

## 2011-07-16 LAB — PROTIME-INR
INR: 0.9
Prothrombin Time: 12.7

## 2011-07-16 LAB — BASIC METABOLIC PANEL
BUN: 17
Calcium: 9.6
Creatinine, Ser: 0.88
GFR calc non Af Amer: >60

## 2011-07-19 LAB — CBC
Hemoglobin: 14.4
MCHC: 33.8
Platelets: 250
RBC: 4.63
WBC: 7.1

## 2011-07-19 LAB — DIFFERENTIAL
Basophils Relative: 0
Eosinophils Absolute: 0.4
Lymphs Abs: 1.1
Monocytes Relative: 8
Neutro Abs: 5
Neutrophils Relative %: 71

## 2011-10-15 ENCOUNTER — Ambulatory Visit: Payer: Medicare Other | Attending: Family Medicine | Admitting: Physical Therapy

## 2011-10-15 DIAGNOSIS — R269 Unspecified abnormalities of gait and mobility: Secondary | ICD-10-CM | POA: Insufficient documentation

## 2011-10-15 DIAGNOSIS — IMO0001 Reserved for inherently not codable concepts without codable children: Secondary | ICD-10-CM | POA: Insufficient documentation

## 2011-10-15 DIAGNOSIS — R279 Unspecified lack of coordination: Secondary | ICD-10-CM | POA: Insufficient documentation

## 2011-10-19 HISTORY — PX: OTHER SURGICAL HISTORY: SHX169

## 2011-10-26 ENCOUNTER — Ambulatory Visit: Payer: Medicare Other | Attending: Family Medicine | Admitting: Physical Therapy

## 2011-10-26 DIAGNOSIS — IMO0001 Reserved for inherently not codable concepts without codable children: Secondary | ICD-10-CM | POA: Insufficient documentation

## 2011-10-26 DIAGNOSIS — R269 Unspecified abnormalities of gait and mobility: Secondary | ICD-10-CM | POA: Insufficient documentation

## 2011-10-26 DIAGNOSIS — R279 Unspecified lack of coordination: Secondary | ICD-10-CM | POA: Insufficient documentation

## 2011-10-28 ENCOUNTER — Ambulatory Visit: Payer: Medicare Other | Admitting: Physical Therapy

## 2011-11-02 ENCOUNTER — Encounter: Payer: Medicare Other | Admitting: Physical Therapy

## 2011-11-05 ENCOUNTER — Ambulatory Visit: Payer: Medicare Other | Admitting: Physical Therapy

## 2011-11-09 ENCOUNTER — Ambulatory Visit: Payer: Medicare Other | Admitting: Physical Therapy

## 2011-11-11 ENCOUNTER — Ambulatory Visit: Payer: Medicare Other | Admitting: Physical Therapy

## 2011-11-16 ENCOUNTER — Ambulatory Visit: Payer: Medicare Other | Admitting: Physical Therapy

## 2011-11-18 ENCOUNTER — Ambulatory Visit: Payer: Medicare Other | Admitting: Physical Therapy

## 2011-11-23 ENCOUNTER — Encounter: Payer: Medicare Other | Admitting: Physical Therapy

## 2011-11-25 ENCOUNTER — Encounter: Payer: Medicare Other | Admitting: Physical Therapy

## 2012-05-18 ENCOUNTER — Ambulatory Visit: Payer: Medicare Other | Attending: Orthopedic Surgery | Admitting: Physical Therapy

## 2012-05-18 DIAGNOSIS — R269 Unspecified abnormalities of gait and mobility: Secondary | ICD-10-CM | POA: Insufficient documentation

## 2012-05-18 DIAGNOSIS — R262 Difficulty in walking, not elsewhere classified: Secondary | ICD-10-CM | POA: Insufficient documentation

## 2012-05-18 DIAGNOSIS — M2550 Pain in unspecified joint: Secondary | ICD-10-CM | POA: Insufficient documentation

## 2012-05-18 DIAGNOSIS — IMO0001 Reserved for inherently not codable concepts without codable children: Secondary | ICD-10-CM | POA: Insufficient documentation

## 2012-05-18 DIAGNOSIS — M25669 Stiffness of unspecified knee, not elsewhere classified: Secondary | ICD-10-CM | POA: Insufficient documentation

## 2012-05-19 ENCOUNTER — Ambulatory Visit: Payer: Medicare Other | Admitting: Physical Therapy

## 2012-05-22 ENCOUNTER — Encounter: Payer: Medicare Other | Admitting: Physical Therapy

## 2012-05-22 ENCOUNTER — Ambulatory Visit: Payer: Medicare Other | Admitting: Physical Therapy

## 2012-05-24 ENCOUNTER — Ambulatory Visit: Payer: Medicare Other | Admitting: Physical Therapy

## 2012-05-25 ENCOUNTER — Ambulatory Visit: Payer: Medicare Other | Admitting: Physical Therapy

## 2012-05-25 ENCOUNTER — Encounter: Payer: Medicare Other | Admitting: Physical Therapy

## 2012-05-29 ENCOUNTER — Ambulatory Visit: Payer: Medicare Other | Admitting: Physical Therapy

## 2012-05-29 ENCOUNTER — Encounter: Payer: Medicare Other | Admitting: Physical Therapy

## 2012-05-30 ENCOUNTER — Encounter: Payer: Medicare Other | Admitting: Physical Therapy

## 2012-05-30 ENCOUNTER — Ambulatory Visit: Payer: Medicare Other | Admitting: Rehabilitation

## 2012-05-31 ENCOUNTER — Ambulatory Visit: Payer: Medicare Other | Admitting: Physical Therapy

## 2012-05-31 ENCOUNTER — Encounter: Payer: Medicare Other | Admitting: Physical Therapy

## 2012-06-01 ENCOUNTER — Encounter: Payer: Medicare Other | Admitting: Physical Therapy

## 2012-06-01 ENCOUNTER — Ambulatory Visit: Payer: Medicare Other | Admitting: Physical Therapy

## 2012-06-05 ENCOUNTER — Ambulatory Visit: Payer: Medicare Other | Admitting: Physical Therapy

## 2012-06-07 ENCOUNTER — Ambulatory Visit: Payer: Medicare Other | Admitting: Rehabilitation

## 2012-06-08 ENCOUNTER — Ambulatory Visit: Payer: Medicare Other | Admitting: Rehabilitation

## 2012-07-05 ENCOUNTER — Ambulatory Visit: Payer: Medicare Other | Attending: Orthopedic Surgery | Admitting: Physical Therapy

## 2012-07-05 DIAGNOSIS — R269 Unspecified abnormalities of gait and mobility: Secondary | ICD-10-CM | POA: Insufficient documentation

## 2012-07-05 DIAGNOSIS — M25669 Stiffness of unspecified knee, not elsewhere classified: Secondary | ICD-10-CM | POA: Insufficient documentation

## 2012-07-05 DIAGNOSIS — R262 Difficulty in walking, not elsewhere classified: Secondary | ICD-10-CM | POA: Insufficient documentation

## 2012-07-05 DIAGNOSIS — IMO0001 Reserved for inherently not codable concepts without codable children: Secondary | ICD-10-CM | POA: Insufficient documentation

## 2012-07-05 DIAGNOSIS — M2550 Pain in unspecified joint: Secondary | ICD-10-CM | POA: Insufficient documentation

## 2012-07-06 ENCOUNTER — Ambulatory Visit: Payer: Medicare Other | Admitting: Physical Therapy

## 2012-07-11 ENCOUNTER — Ambulatory Visit: Payer: Medicare Other | Admitting: Physical Therapy

## 2012-07-17 ENCOUNTER — Ambulatory Visit: Payer: Medicare Other | Admitting: Physical Therapy

## 2012-07-19 ENCOUNTER — Ambulatory Visit: Payer: Medicare Other | Attending: Orthopedic Surgery | Admitting: Physical Therapy

## 2012-07-19 DIAGNOSIS — R269 Unspecified abnormalities of gait and mobility: Secondary | ICD-10-CM | POA: Insufficient documentation

## 2012-07-19 DIAGNOSIS — IMO0001 Reserved for inherently not codable concepts without codable children: Secondary | ICD-10-CM | POA: Insufficient documentation

## 2012-07-19 DIAGNOSIS — R262 Difficulty in walking, not elsewhere classified: Secondary | ICD-10-CM | POA: Insufficient documentation

## 2012-07-19 DIAGNOSIS — M25669 Stiffness of unspecified knee, not elsewhere classified: Secondary | ICD-10-CM | POA: Insufficient documentation

## 2012-07-19 DIAGNOSIS — M2550 Pain in unspecified joint: Secondary | ICD-10-CM | POA: Insufficient documentation

## 2012-07-24 ENCOUNTER — Ambulatory Visit: Payer: Medicare Other | Admitting: Physical Therapy

## 2012-07-26 ENCOUNTER — Ambulatory Visit: Payer: Medicare Other | Admitting: Physical Therapy

## 2012-07-28 ENCOUNTER — Ambulatory Visit: Payer: Medicare Other | Admitting: Physical Therapy

## 2012-08-02 ENCOUNTER — Ambulatory Visit: Payer: Medicare Other | Admitting: Physical Therapy

## 2012-08-04 ENCOUNTER — Ambulatory Visit: Payer: Medicare Other | Admitting: Physical Therapy

## 2012-08-21 ENCOUNTER — Ambulatory Visit: Payer: Medicare Other | Admitting: Physical Therapy

## 2012-08-24 ENCOUNTER — Ambulatory Visit: Payer: Medicare Other | Attending: Orthopedic Surgery | Admitting: Physical Therapy

## 2012-08-24 DIAGNOSIS — M2550 Pain in unspecified joint: Secondary | ICD-10-CM | POA: Insufficient documentation

## 2012-08-24 DIAGNOSIS — R269 Unspecified abnormalities of gait and mobility: Secondary | ICD-10-CM | POA: Insufficient documentation

## 2012-08-24 DIAGNOSIS — IMO0001 Reserved for inherently not codable concepts without codable children: Secondary | ICD-10-CM | POA: Insufficient documentation

## 2012-08-24 DIAGNOSIS — R262 Difficulty in walking, not elsewhere classified: Secondary | ICD-10-CM | POA: Insufficient documentation

## 2012-08-24 DIAGNOSIS — M25669 Stiffness of unspecified knee, not elsewhere classified: Secondary | ICD-10-CM | POA: Insufficient documentation

## 2012-08-28 ENCOUNTER — Encounter: Payer: Medicare Other | Admitting: Physical Therapy

## 2012-08-30 ENCOUNTER — Ambulatory Visit: Payer: Medicare Other | Admitting: Physical Therapy

## 2012-08-31 ENCOUNTER — Encounter: Payer: Medicare Other | Admitting: Physical Therapy

## 2012-09-01 ENCOUNTER — Ambulatory Visit: Payer: Medicare Other | Admitting: Physical Therapy

## 2012-09-04 ENCOUNTER — Encounter: Payer: Medicare Other | Admitting: Physical Therapy

## 2012-09-05 ENCOUNTER — Ambulatory Visit: Payer: Medicare Other | Admitting: Physical Therapy

## 2012-09-07 ENCOUNTER — Encounter: Payer: Medicare Other | Admitting: Physical Therapy

## 2012-09-11 ENCOUNTER — Ambulatory Visit: Payer: Medicare Other | Admitting: Physical Therapy

## 2012-09-13 ENCOUNTER — Ambulatory Visit: Payer: Medicare Other | Admitting: Physical Therapy

## 2012-09-18 ENCOUNTER — Ambulatory Visit: Payer: Medicare Other | Attending: Orthopedic Surgery | Admitting: Physical Therapy

## 2012-09-18 DIAGNOSIS — R262 Difficulty in walking, not elsewhere classified: Secondary | ICD-10-CM | POA: Insufficient documentation

## 2012-09-18 DIAGNOSIS — IMO0001 Reserved for inherently not codable concepts without codable children: Secondary | ICD-10-CM | POA: Insufficient documentation

## 2012-09-18 DIAGNOSIS — M2550 Pain in unspecified joint: Secondary | ICD-10-CM | POA: Insufficient documentation

## 2012-09-18 DIAGNOSIS — R269 Unspecified abnormalities of gait and mobility: Secondary | ICD-10-CM | POA: Insufficient documentation

## 2012-09-18 DIAGNOSIS — M25669 Stiffness of unspecified knee, not elsewhere classified: Secondary | ICD-10-CM | POA: Insufficient documentation

## 2012-10-23 ENCOUNTER — Telehealth: Payer: Self-pay | Admitting: Internal Medicine

## 2012-10-23 NOTE — Telephone Encounter (Signed)
Records were shredded @ Patient's request.

## 2013-03-16 ENCOUNTER — Emergency Department (HOSPITAL_BASED_OUTPATIENT_CLINIC_OR_DEPARTMENT_OTHER): Payer: Medicare Other

## 2013-03-16 ENCOUNTER — Encounter (HOSPITAL_BASED_OUTPATIENT_CLINIC_OR_DEPARTMENT_OTHER): Payer: Self-pay | Admitting: Family Medicine

## 2013-03-16 ENCOUNTER — Emergency Department (HOSPITAL_BASED_OUTPATIENT_CLINIC_OR_DEPARTMENT_OTHER)
Admission: EM | Admit: 2013-03-16 | Discharge: 2013-03-16 | Disposition: A | Discharge status: Home / Self Care | Payer: Medicare Other | Attending: Emergency Medicine | Admitting: Emergency Medicine

## 2013-03-16 DIAGNOSIS — S40019A Contusion of unspecified shoulder, initial encounter: Secondary | ICD-10-CM | POA: Insufficient documentation

## 2013-03-16 DIAGNOSIS — X500XXA Overexertion from strenuous movement or load, initial encounter: Secondary | ICD-10-CM | POA: Insufficient documentation

## 2013-03-16 DIAGNOSIS — Y9289 Other specified places as the place of occurrence of the external cause: Secondary | ICD-10-CM | POA: Insufficient documentation

## 2013-03-16 DIAGNOSIS — S40012D Contusion of left shoulder, subsequent encounter: Secondary | ICD-10-CM

## 2013-03-16 DIAGNOSIS — R011 Cardiac murmur, unspecified: Secondary | ICD-10-CM | POA: Insufficient documentation

## 2013-03-16 DIAGNOSIS — I959 Hypotension, unspecified: Secondary | ICD-10-CM | POA: Insufficient documentation

## 2013-03-16 DIAGNOSIS — Z880 Allergy status to penicillin: Secondary | ICD-10-CM | POA: Insufficient documentation

## 2013-03-16 DIAGNOSIS — Z79899 Other long term (current) drug therapy: Secondary | ICD-10-CM | POA: Insufficient documentation

## 2013-03-16 DIAGNOSIS — Y9389 Activity, other specified: Secondary | ICD-10-CM | POA: Insufficient documentation

## 2013-03-16 DIAGNOSIS — M75102 Unspecified rotator cuff tear or rupture of left shoulder, not specified as traumatic: Secondary | ICD-10-CM

## 2013-03-16 DIAGNOSIS — I4891 Unspecified atrial fibrillation: Secondary | ICD-10-CM | POA: Insufficient documentation

## 2013-03-16 DIAGNOSIS — Z7901 Long term (current) use of anticoagulants: Secondary | ICD-10-CM | POA: Insufficient documentation

## 2013-03-16 DIAGNOSIS — S43429A Sprain of unspecified rotator cuff capsule, initial encounter: Secondary | ICD-10-CM | POA: Insufficient documentation

## 2013-03-16 DIAGNOSIS — IMO0001 Reserved for inherently not codable concepts without codable children: Secondary | ICD-10-CM | POA: Insufficient documentation

## 2013-03-16 DIAGNOSIS — I359 Nonrheumatic aortic valve disorder, unspecified: Secondary | ICD-10-CM | POA: Insufficient documentation

## 2013-03-16 HISTORY — DX: Nonrheumatic aortic (valve) stenosis: I35.0

## 2013-03-16 LAB — CBC WITH DIFFERENTIAL/PLATELET
Basophils Absolute: 0 10*3/uL (ref 0.0–0.1)
Basophils Relative: 0 % (ref 0–1)
HCT: 40.7 % (ref 39.0–52.0)
Lymphocytes Relative: 7 % — ABNORMAL LOW (ref 12–46)
MCHC: 34.4 g/dL (ref 30.0–36.0)
Monocytes Absolute: 1.3 10*3/uL — ABNORMAL HIGH (ref 0.1–1.0)
Neutro Abs: 8.5 10*3/uL — ABNORMAL HIGH (ref 1.7–7.7)
Neutrophils Relative %: 81 % — ABNORMAL HIGH (ref 43–77)
Platelets: 300 10*3/uL (ref 150–400)
RDW: 15.9 % — ABNORMAL HIGH (ref 11.5–15.5)
WBC: 10.5 10*3/uL (ref 4.0–10.5)

## 2013-03-16 LAB — COMPREHENSIVE METABOLIC PANEL
ALT: 10 U/L (ref 0–53)
AST: 19 U/L (ref 0–37)
Calcium: 9.7 mg/dL (ref 8.4–10.5)
Sodium: 132 mEq/L — ABNORMAL LOW (ref 135–145)
Total Protein: 7.6 g/dL (ref 6.0–8.3)

## 2013-03-16 LAB — PROTIME-INR
INR: 2.34 — ABNORMAL HIGH (ref 0.00–1.49)
Prothrombin Time: 24.6 seconds — ABNORMAL HIGH (ref 11.6–15.2)

## 2013-03-16 LAB — OCCULT BLOOD X 1 CARD TO LAB, STOOL: Fecal Occult Bld: NEGATIVE

## 2013-03-16 LAB — HEMOGLOBIN AND HEMATOCRIT, BLOOD: HCT: 39.4 % (ref 39.0–52.0)

## 2013-03-16 MED ORDER — METOPROLOL SUCCINATE ER 25 MG PO TB24: 25.0000 mg | ORAL_TABLET | Freq: Every day | ORAL | Status: DC

## 2013-03-16 MED ORDER — KETOROLAC TROMETHAMINE 60 MG/2ML IM SOLN
60.0000 mg | Freq: Once | INTRAMUSCULAR | Status: AC
  Administered 2013-03-16: 60 mg via INTRAMUSCULAR
  Filled 2013-03-16: qty 2

## 2013-03-16 MED ORDER — HYDROCODONE-ACETAMINOPHEN 5-325 MG PO TABS: 2.0000 | ORAL_TABLET | ORAL | Status: DC | PRN

## 2013-03-16 MED ORDER — METHYLPREDNISOLONE 4 MG PO KIT: PACK | ORAL | Status: DC

## 2013-03-16 MED ORDER — SODIUM CHLORIDE 0.9 % IV BOLUS (SEPSIS)
1000.0000 mL | Freq: Once | INTRAVENOUS | Status: AC
  Administered 2013-03-16: 1000 mL via INTRAVENOUS

## 2013-03-16 MED ORDER — IOHEXOL 350 MG/ML SOLN
100.0000 mL | Freq: Once | INTRAVENOUS | Status: AC | PRN
  Administered 2013-03-16: 100 mL via INTRAVENOUS

## 2013-03-16 MED ORDER — METOPROLOL SUCCINATE ER 25 MG PO TB24
25.0000 mg | ORAL_TABLET | Freq: Every day | ORAL | Status: DC
  Filled 2013-03-16: qty 1

## 2013-03-16 NOTE — ED Notes (Addendum)
Pt c/o pain to left shoulder after falling in [place] 10 days ago. Pt sts he had neg XR and was given an injection at that time. Pt drove self to ED today.

## 2013-03-16 NOTE — ED Provider Notes (Signed)
Patient found to have sinus tachycardia History     CSN: [id number]  Arrival date & time 03/16/13  0347   First MD Initiated Contact with Patient 03/16/13 1010      Chief Complaint  Patient presents with  . Shoulder Injury    (Consider location/radiation/quality/duration/timing/severity/associated sxs/prior treatment) HPI Comments: Patient complains of left shoulder pain for the past 10 days. He states he was in [place] and was moving suitcases when he stumbled and stretched his arm injuring his left shoulder. He was seen in the hospital there and told he had no fracture. He did receive some type of injection. He's had worsening pain and reduced range of motion of the shoulder. Denies falling denies hitting his head. Denies direct impact to the shoulder. Denies any chest pain or shortness of breath. He has not been taking anything at home for pain. He has a history of atrial fibrillation on Coumadin.  The history is provided by the patient.    Past Medical History  Diagnosis Date  . Aortic stenosis     Past Surgical History  Procedure Laterality Date  . Back surgery    . Joint replacement    . Neck surgery      Family History  Problem Relation Age of Onset  . Hypertension Father 33    History  Substance Use Topics  . Smoking status: Never Smoker   . Smokeless tobacco: Not on file  . Alcohol Use: Yes      Review of Systems  Constitutional: Negative for fever, activity change and appetite change.  HENT: Negative for congestion and rhinorrhea.   Respiratory: Negative for cough, chest tightness and shortness of breath.   Gastrointestinal: Negative for nausea, vomiting and abdominal pain.  Genitourinary: Negative for dysuria and hematuria.  Musculoskeletal: Positive for myalgias, joint swelling and arthralgias.  Neurological: Negative for weakness and headaches.  A complete 10 system review of systems was obtained and all systems are negative except as noted in the HPI  and PMH.    Allergies  Ceftin; Penicillins; and Prilosec  Home Medications   Current Outpatient Rx  Name  Route  Sig  Dispense  Refill  . digoxin (LANOXIN) 0.25 MG tablet   Oral   Take 250 mcg by mouth daily.         . fish oil-omega-3 fatty acids 1000 MG capsule   Oral   Take 2 g by mouth daily.         Marland Kitchen HYDROcodone-acetaminophen (NORCO/VICODIN) 5-325 MG per tablet   Oral   Take 2 tablets by mouth every 4 (four) hours as needed for pain.   10 tablet   0   . metoprolol succinate (TOPROL-XL) 25 MG 24 hr tablet   Oral   Take 1 tablet (25 mg total) by mouth daily.   30 tablet   0   . Multiple Vitamin (MULTIVITAMIN WITH MINERALS) TABS   Oral   Take 1 tablet by mouth daily.         Marland Kitchen warfarin (COUMADIN) 5 MG tablet   Oral   Take 5 mg by mouth daily.           BP 93/64  Pulse 105  Temp(Src) 98.3 F (36.8 C) (Oral)  Resp 22  SpO2 94%  Physical Exam  Constitutional: He is oriented to person, place, and time. He appears well-developed and well-nourished. No distress.  HENT:  Head: Normocephalic and atraumatic.  Mouth/Throat: Oropharynx is clear and moist. No oropharyngeal exudate.  Eyes: Conjunctivae and EOM are normal. Pupils are equal, round, and reactive to light.  Neck: Normal range of motion. Neck supple.  No C spine pain, stepoff or deformity  Cardiovascular: Normal rate and regular rhythm.   Murmur heard. Pulmonary/Chest: Effort normal and breath sounds normal. No respiratory distress.  Abdominal: Soft. Bowel sounds are normal. There is no tenderness. There is no rebound and no guarding.  Musculoskeletal: He exhibits edema and tenderness.  Mild diffuse tenderness and swelling to left shoulder was reduced range of motion. +2 radial pulse, cardinal hand movements are intact  Neurological: He is alert and oriented to person, place, and time. No cranial nerve deficit. He exhibits normal muscle tone. Coordination normal.  Skin: Skin is Schmoll.    ED  Course  Procedures (including critical care time)  Labs Reviewed  PROTIME-INR - Abnormal; Notable for the following:    Prothrombin Time 24.6 (*)    INR 2.34 (*)    All other components within normal limits  CBC WITH DIFFERENTIAL - Abnormal; Notable for the following:    RDW 15.9 (*)    Neutrophils Relative % 81 (*)    Neutro Abs 8.5 (*)    Lymphocytes Relative 7 (*)    Monocytes Absolute 1.3 (*)    All other components within normal limits  COMPREHENSIVE METABOLIC PANEL - Abnormal; Notable for the following:    Sodium 132 (*)    Chloride 95 (*)    Glucose, Bld 139 (*)    GFR calc non Af Amer 79 (*)    All other components within normal limits  DIGOXIN LEVEL - Abnormal; Notable for the following:    Digoxin Level 0.3 (*)    All other components within normal limits   Dg Chest 2 View  03/16/2013   *RADIOLOGY REPORT*  Clinical Data: Larey Seat 10 days ago while [place] in [place].  CHEST - 2 VIEW  Comparison: Two-view chest x-ray 01/12/2013 [facility] and 07/17/2008, 11/14/2007 [facility].  Findings: Cardiomediastinal silhouette unremarkable for age, unchanged.  Lungs clear.  Bronchovascular markings normal. Pulmonary vascularity normal.  No pneumothorax.  No pleural effusions.  Thoracic scoliosis convex right and degenerative changes in both shoulders.  IMPRESSION: No acute cardiopulmonary disease.   Original Report Authenticated By: Hulan Saas, M.D.   Ct Head Wo Contrast  03/16/2013   *RADIOLOGY REPORT*  Clinical Data: Fall 10 days ago.  CT HEAD WITHOUT CONTRAST  Technique:  Contiguous axial images were obtained from the base of the skull through the vertex without contrast.  Comparison: None.  Findings: Moderate atrophy.  Chronic microvascular ischemic change in the white matter.  No acute infarct.  Negative for hemorrhage or mass.  Negative for skull fracture.  Extensive sinusitis.  Prior medial antrectomy bilaterally. Complete opacification of the  right maxillary sinus with retained secretions, as well as some calcification in the secretions.  There is bony thickening of the right maxillary sinus.  There is opacification of the right sphenoid ethmoid and frontal sinus. Mucosal edema is present in the left paranasal sinuses.  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  IMPRESSION: Atrophy and chronic microvascular ischemia.  No acute intracranial abnormality.  Extensive chronic sinusitis.   Original Report Authenticated By: Janeece Riggers, M.D.   Ct Angio Chest Pe W/cm &/or Wo Cm  03/16/2013   *RADIOLOGY REPORT*  Clinical Data: Shortness of breath and recent travels to [place]. Left shoulder pain after fall.  CT ANGIOGRAPHY CHEST  Technique:  Multidetector CT imaging of  the chest using the standard protocol during bolus administration of intravenous contrast. Multiplanar reconstructed images including MIPs were obtained and reviewed to evaluate the vascular anatomy.  Contrast: OMNIPAQUE IOHEXOL 350 MG/ML SOLN  Comparison: Chest CT 12/07/2006  Findings: Negative for pulmonary embolism.  The aortic valve is calcified and there are mitral annular calcifications.  Evidence of coronary artery calcifications.  No significant pericardial or pleural fluid.  No significant chest lymphadenopathy.  There is a edema in the left shoulder soft tissues and stranding in the left axilla. Partially visualized low density structure in the left kidney upper pole measures 4.1 cm and suggestive for a cyst. Otherwise, upper abdominal structures are unremarkable.  The trachea and mainstem bronchi are patent.  Again seen is a focal area of pleural thickening along the right minor fissure.  The lungs are essentially clear without airspace disease or edema. There are mild peripheral densities that could represent chronic changes or atelectasis.  There are old medial left lower rib fractures.  Left shoulder is located.  No evidence for fracture involving the visualized left humerus.   IMPRESSION: Negative for pulmonary embolism.  No acute chest abnormality.  There is extensive edema and stranding in the left shoulder that extends into the left axilla.  Findings are concerning for a soft tissue injury in this location.   Original Report Authenticated By: Richarda Overlie, M.D.   Ct Shoulder Left Wo Contrast  03/16/2013   *RADIOLOGY REPORT*  Clinical Data: Fall 10 days ago.  Left shoulder pain.  CT OF THE LEFT SHOULDER WITHOUT CONTRAST  Technique:  Multidetector CT imaging was performed according to the standard protocol. Multiplanar CT image reconstructions were also generated.  Comparison: Radiographs 03/16/2013.  Findings: The glenohumeral joint is located. Moderate - severe glenohumeral osteoarthritis is present.  Type 2 acromion with moderate to severe AC joint osteoarthritis.  Chronic degenerative remodeling along the undersurface of the acromion in the superior humeral head consistent with chronic rotator cuff tear.  There is severe fatty atrophy of subscapularis and the supraspinatus muscles compatible with chronic rotator cuff tears.  Fluid is present along the distal torn subscapularis tendon, not unexpected in the setting of full-thickness rotator cuff tear.  Humeral head is high-riding. Greater tuberosity and lesser tuberosity are intact.  Visualized clavicle is normal.  No displaced rib fractures are seen.  Scapula intact.  Glenohumeral effusion is likely degenerative.  IMPRESSION: 1.  Moderate to severe glenohumeral and AC joint osteoarthritis. 2.  Chronic rotator cuff tears of supraspinatus and subscapularis with fatty atrophy. 3.  No fracture or acute osseous abnormality identified.   Original Report Authenticated By: Andreas Newport, M.D.   Dg Shoulder Left  03/16/2013   *RADIOLOGY REPORT*  Clinical Data: Larey Seat 10 days ago while vacationing in [place].  Left shoulder pain.  LEFT SHOULDER - 2+ VIEW  Comparison: None.  Findings: No evidence of acute or subacute fracture or dislocation.  Near complete loss of the subacromial space.  Moderate degenerative changes involving the acromioclavicular joint.  Mild osseous demineralization.  IMPRESSION: No acute or subacute abnormalities.  Near complete loss of the subacromial space consistent with chronic supraspinatus tendon disease.  Degenerative changes involving the [facility] joint.   Original Report Authenticated By: Hulan Saas, M.D.     1. Shoulder contusion, left, subsequent encounter   2. Rotator cuff tear, left       MDM  Shoulder injury 10 days ago from stretching motion. No fall. Reduced range of motion secondary to pain. No focal  weakness, numbness or tingling. Denies head injury.    X-rays negative for acute traumatic injury.  Sinus tachycardia at 105 noted. Patient denies chest pain, shortness of breath, palpitations. States he stopped his metoprolol on his own about one year ago. INR is therapeutic, doubt PE despite recent travel.  Suspect soft tissue injury left shoulder. There is no bony abnormality. Possible hematoma given Coumadin use. INR is is 2.4, hemoglobin is stable. Severe arthritis with chronic rotator cuff tear seen on CT.  Patient has persistent tachycardia despite IV fluids. His blood pressure is soft in the 90s. Denies any dizziness, lightheadedness, chest pain or shortness of breath. Hemoglobin stable. Attempted to discuss with Dr. Eldridge Dace who patient is scheduled to see next week is not available.   given tachycardia and hypotension for observation is reasonable given patient's age and coumadin use.  FOBT negative.  BP 92/66  Pulse 94  Temp(Src) 98.3 F (36.8 C) (Oral)  Resp 18  SpO2 94%  Discussed case extensively with Dr. Waymon Amato.  HR has improved to 94 after fluids, BP remains borderline low.  Patient states his normal BP is 105 systolic. Patient remains completely asymptomatic.  He has been observed in the ED for several hours without deterioration in status or vitals. Dr. Waymon Amato feels  patient has been adequately observed and admission to hospital would not be beneficial. Patient states he feels fine and wants to go home.  Patient and wife (who is much younger) states they will watch him at home. Recheck hemoglobin is stable. FOBT negative. Explicit return precautions discussed including dizziness, lightheadedness, chest pain, SOB, rectal bleeding, palpitations or any other concerns. Recommended patient call Dr. Eldridge Dace and try to move up his appointment next week.   Date: 03/16/2013  Rate: 108  Rhythm: sinus tachycardia  QRS Axis: left  Intervals: PR prolonged  ST/T Wave abnormalities: nonspecific ST/T changes  Conduction Disutrbances:first-degree A-V block  and left anterior fascicular block  Narrative Interpretation:   Old EKG Reviewed: changes noted    Glynn Octave, MD 03/16/13 1720

## 2013-03-16 NOTE — ED Notes (Signed)
MD at bedside. 

## 2013-06-19 ENCOUNTER — Other Ambulatory Visit: Payer: Self-pay | Admitting: Family Medicine

## 2013-06-19 DIAGNOSIS — M5416 Radiculopathy, lumbar region: Secondary | ICD-10-CM

## 2013-06-25 ENCOUNTER — Ambulatory Visit
Admission: RE | Admit: 2013-06-25 | Discharge: 2013-06-25 | Disposition: A | Discharge status: Home / Self Care | Payer: Medicare Other | Source: Ambulatory Visit | Attending: Family Medicine | Admitting: Family Medicine

## 2013-06-25 ENCOUNTER — Other Ambulatory Visit: Payer: Medicare Other

## 2013-06-25 DIAGNOSIS — M5416 Radiculopathy, lumbar region: Secondary | ICD-10-CM

## 2013-08-21 ENCOUNTER — Encounter: Payer: Self-pay | Admitting: Interventional Cardiology

## 2013-09-25 ENCOUNTER — Ambulatory Visit (INDEPENDENT_AMBULATORY_CARE_PROVIDER_SITE_OTHER): Payer: Medicare Other | Admitting: Interventional Cardiology

## 2013-09-25 ENCOUNTER — Encounter: Payer: Self-pay | Admitting: Interventional Cardiology

## 2013-09-25 VITALS — BP 128/54 | HR 62 | Ht 69.5 in | Wt 162.4 lb

## 2013-09-25 DIAGNOSIS — I482 Chronic atrial fibrillation, unspecified: Secondary | ICD-10-CM

## 2013-09-25 DIAGNOSIS — I4891 Unspecified atrial fibrillation: Secondary | ICD-10-CM

## 2013-09-25 DIAGNOSIS — I359 Nonrheumatic aortic valve disorder, unspecified: Secondary | ICD-10-CM

## 2013-09-25 DIAGNOSIS — E782 Mixed hyperlipidemia: Secondary | ICD-10-CM

## 2013-09-25 NOTE — Patient Instructions (Signed)
Your physician recommends that you continue on your current medications as directed. Please refer to the Current Medication list given to you today.   Your physician wants you to follow-up in: 1 year follow-up with Dr. Varanasi. You will receive a reminder letter in the mail two months in advance. If you don't receive a letter, please call our office to schedule the follow-up appointment.  

## 2013-09-25 NOTE — Progress Notes (Signed)
Patient ID: John [name], male   DOB: 1926/01/19, 79 y.o.   MRN: [id number]    [address] [place], [place]  [address] Phone: [phone] Fax:  [phone]  Date:  09/25/2013   ID:  John [name], DOB May 21, 1926, MRN [id number]  PCP:   Duane Lope, MD      History of Present Illness: Jusiah Aguayo is a 79 y.o. male who has had AFib. HE has not taken his Toprol in some time. He had an accident in [place] in the spring of 2014. He was seen there and treated. The pain returned when he came back and he went to the ER. He has had a rotator cuff. His HR has been high as well. He does not feel palpitations. Of note, he is not allergic to Toprol but has felt tired at times in the past when taking Toprol. This is why he has not been taking it regularly.    Wt Readings from Last 3 Encounters:  09/25/13 162 lb 6.4 oz (73.664 kg)     Past Medical History  Diagnosis Date  . Aortic stenosis     Current Outpatient Prescriptions  Medication Sig Dispense Refill  . cholestyramine light (PREVALITE) 4 GM/DOSE powder Take 1 g by mouth as needed.      . digoxin (LANOXIN) 0.25 MG tablet Take 250 mcg by mouth daily.      . metoprolol tartrate (LOPRESSOR) 25 MG tablet Take 25 mg by mouth as needed.      . Multiple Vitamin (MULTIVITAMIN WITH MINERALS) TABS Take 1 tablet by mouth daily.      Marland Kitchen warfarin (COUMADIN) 5 MG tablet Take 5 mg by mouth daily.      . [DISCONTINUED] metoprolol succinate (TOPROL-XL) 25 MG 24 hr tablet Take 1 tablet (25 mg total) by mouth daily.  30 tablet  0   No current facility-administered medications for this visit.    Allergies:    Allergies  Allergen Reactions  . Percocet [Oxycodone-Acetaminophen]     Craziness, hallucinations.    Social History:  The patient  reports that he has never smoked. He does not have any smokeless tobacco history on file. He reports that he drinks alcohol.   Family History:  The patient's family history includes Hypertension (age of  onset: 41) in his father.   ROS:  Please see the history of present illness.  No nausea, vomiting.  No fevers, chills.  No focal weakness.  No dysuria.    All other systems reviewed and negative.   PHYSICAL EXAM: VS:  BP 128/54  Pulse 62  Ht 5' 9.5" (1.765 m)  Wt 162 lb 6.4 oz (73.664 kg)  BMI 23.65 kg/m2 Well nourished, well developed, in no acute distress HEENT: normal Neck: no JVD, no carotid bruits Cardiac:  normal S1, S2; Irregularly irregular Lungs:  clear to auscultation bilaterally, no wheezing, rhonchi or rales Abd: soft, nontender, no hepatomegaly Ext: no edema Skin: [place] and dry Neuro:   no focal abnormalities noted  EKG:  Atrial flutter 2:1 conduction in 6/14     ASSESSMENT AND PLAN:  Atrial fibrillation   Started Metoprolol Tartrate Tablet, 25 MG, 1 tablet, Orally, Twice a day, 30 day(s), 60, Refills 11 at last visit . Notes: Heart rate increased. COumadin for stroke prevention. He is not taking metoprolol anymore. Heart rate better today.  He is using metoprolol as needed now.  Not used for 2 months. Have explained the risks of untreated tachycardia to  him in the past.  He typically adjusts his meds as he pleases. 2. Aortic valve disorders  Notes: Severe AS by echo, but he has not had any CP or syncope. would consider TAVR if he develops symptoms. he did not pass out with his fall in [place].  3. Encounter for long-term (current) use of anticoagulants  Notes: INR to be checked and coumadin dose to be adjused with his PCP.  Last INR was 3.3.  Hyperlipidemia: LDL has been as high as 155 in the past.  Using cholestyramine prn.  He is not interested in lipid lowering therapy.  Last LDL was better in 2014. LDL 112.  He prefers annual f/u due to the inconvenience of the new office.  Signed, Fredric Mare, MD, [facility] 09/25/2013 3:34 PM

## 2013-09-27 DIAGNOSIS — E782 Mixed hyperlipidemia: Secondary | ICD-10-CM | POA: Insufficient documentation

## 2013-09-27 DIAGNOSIS — I4821 Permanent atrial fibrillation: Secondary | ICD-10-CM | POA: Insufficient documentation

## 2013-09-27 DIAGNOSIS — I359 Nonrheumatic aortic valve disorder, unspecified: Secondary | ICD-10-CM | POA: Insufficient documentation

## 2013-09-27 HISTORY — DX: Mixed hyperlipidemia: E78.2

## 2013-10-22 ENCOUNTER — Telehealth: Payer: Self-pay | Admitting: Interventional Cardiology

## 2013-10-22 NOTE — Telephone Encounter (Signed)
Received request from Nurse fax box, documents faxed for surgical clearance. [facility] Fax number: [phone] Attention: 10/22/13/Km

## 2013-10-23 ENCOUNTER — Ambulatory Visit: Payer: Medicare Other | Admitting: Interventional Cardiology

## 2013-11-06 ENCOUNTER — Ambulatory Visit: Payer: Medicare Other | Admitting: Interventional Cardiology

## 2013-12-19 ENCOUNTER — Telehealth: Payer: Self-pay | Admitting: Interventional Cardiology

## 2013-12-19 NOTE — Telephone Encounter (Signed)
Received request from Nurse fax box, documents faxed for surgical clearance. To: [facility] Fax number: [phone] Attention: 3.4.15/kdm

## 2013-12-19 NOTE — Telephone Encounter (Signed)
New message     Need clearance to be off coumadin for 5 days to have a shoulder arthoscopy.  They  (Dr Marlinda Mike in [place] [phone]) have sent several faxes and now the pt is calling.  Please call their office so that pt can have his procedure.

## 2013-12-19 NOTE — Telephone Encounter (Signed)
According to our records Dr Irish Lack completed surgical clearance form 10/22/13 for Dr Marlinda Mike (see [employer] tab). It was faxed to Dr Carlynn Spry office 10/22/13. Pt advised this was done 10/22/13 and  Maudie Mercury in [place] will refax surgical clearance form to Dr Carlynn Spry office today.

## 2013-12-24 ENCOUNTER — Telehealth: Payer: Self-pay | Admitting: Interventional Cardiology

## 2013-12-24 NOTE — Telephone Encounter (Signed)
New Message:  PT is requesting surgical clearance be sent to Dr. Cleatis Polka office.... Phone number [phone]

## 2013-12-24 NOTE — Telephone Encounter (Signed)
Pt notified that clearance was faxed last week.

## 2014-01-01 ENCOUNTER — Telehealth: Payer: Self-pay | Admitting: *Deleted

## 2014-01-01 NOTE — Telephone Encounter (Signed)
[facility] [place] requests coumadin refill for patient, but I do not see where they have been in recently to have levels checked. Please advise. Thanks, MI

## 2014-01-02 ENCOUNTER — Encounter: Payer: Self-pay | Admitting: Emergency Medicine

## 2014-01-02 ENCOUNTER — Ambulatory Visit (INDEPENDENT_AMBULATORY_CARE_PROVIDER_SITE_OTHER): Payer: No Typology Code available for payment source | Admitting: Emergency Medicine

## 2014-01-02 VITALS — BP 108/69 | HR 71 | Ht 69.5 in | Wt 162.0 lb

## 2014-01-02 DIAGNOSIS — Z8739 Personal history of other diseases of the musculoskeletal system and connective tissue: Secondary | ICD-10-CM

## 2014-01-02 DIAGNOSIS — M2170 Unequal limb length (acquired), unspecified site: Secondary | ICD-10-CM | POA: Insufficient documentation

## 2014-01-02 HISTORY — DX: Personal history of other diseases of the musculoskeletal system and connective tissue: Z87.39

## 2014-01-02 NOTE — Assessment & Plan Note (Signed)
Custom orthotic was made for the right foot. This was padded in addition to a 5/16 heel lift.

## 2014-01-02 NOTE — Progress Notes (Signed)
Patient ID: John [name], male   DOB: 18-Sep-1926, 80 y.o.   MRN: [id number] 79 year old male presents for evaluation impression custom orthotics. 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 He wears a splint for this. He subsequently over the last 20 years since this was done has developed a leg length discrepancy. His right leg is markedly shorter than his left. His affect his gait. He is been placing over-the-counter inserts into the heel to accommodate for this however would like something more appropriate. Despite the over-the-counter inserts he still feels like he is sinking into his heel is affecting his gait and causing discomfort with walking.  Pertinent past medical:  Bilateral knee replacements, spinal surgery, left foot drop  Social history: Nonsmoker, occasional alcohol  Family history: Noncontributory  Review of systems as per history of present illness otherwise all systems negative  Examination: BP 108/69  Pulse 71  Ht 5' 9.5" (1.765 m)  Wt 162 lb (73.483 kg)  BMI 23.59 kg/m2 Well-developed well-nourished 80 year old white male awake alert and oriented no acute distress Gait: Left foot drop Leg length:  95cm left, 92.5 cm right  Patient was fitted for a : standard, cushioned, semi-rigid orthotic. The orthotic was heated and afterward the patient stood on the orthotic blank positioned on the orthotic stand. The patient was positioned in subtalar neutral position and 10 degrees of ankle dorsiflexion in a weight bearing stance. After completion of molding, a stable base was applied to the orthotic blank. The blank was ground to a stable position for weight bearing. Size: 9 Base: eva Posting: none Additional orthotic padding: 5/16th heel lift  Greater than 40 minutes of time was spent in evaluation of the patient, obtaining history and discussing treatment plan. After multiple options were discussed decision was made not to myself the patient to  fabricate a custom orthotic for the right foot. He may switch this her shoe to shoe as needed. This orthotic was padded with an EVA base as well as a heel lift to help compensate for his likely discrepancy. After production of the orthotic the patient walk around the clinic with an issue and feels a significant amount of improvement.  He was also given a heel lift to use in dress shoes which cannot accommodate the orthotic.

## 2014-01-02 NOTE — Telephone Encounter (Signed)
Pharmacy aware

## 2014-01-02 NOTE — Telephone Encounter (Signed)
We don't manage his protimes anymore.  This is done by Dr. Melinda Crutch with [facility].  They need to send the refill request to him.

## 2014-01-03 ENCOUNTER — Telehealth: Payer: Self-pay | Admitting: *Deleted

## 2014-01-03 NOTE — Telephone Encounter (Signed)
Message copied by Fernande Boyden on Thu Jan 03, 2014  1:10 PM ------      Message from: SMART, Maralyn Sago      Created: Mon Dec 31, 2013 10:30 AM      Regarding: RE: warfarin       We no longer manage protimes.  This is done by Dr. Melinda Crutch with [facility].  They need to send refills.            ----- Message -----         From: Fernande Boyden, RN         Sent: 12/31/2013   9:32 AM           To: Maralyn Sago Smart, RPH      Subject: warfarin                                                 Refill warfarin to [facility], [address]       ------

## 2014-01-03 NOTE — Telephone Encounter (Signed)
Called and notifed [facility] [place], [place]

## 2014-01-07 ENCOUNTER — Telehealth: Payer: Self-pay | Admitting: Interventional Cardiology

## 2014-01-07 NOTE — Telephone Encounter (Signed)
Spoke with pt and he picked up OTC Nexium 22.3 mg. Pt states the bottle says he needs to check with his doctor since he is taking digoxin and warfarin.

## 2014-01-07 NOTE — Telephone Encounter (Signed)
New message     Would like to talk with nurse regarding nexium .

## 2014-01-07 NOTE — Telephone Encounter (Signed)
Given it can increase his digoxin and warfarin levels, I would suggest patient try to use ranitidine (Zantac) 150 mg, 1 tablet up to twice daily as needed for reflux.  If this doesn't control his reflux and he needs to use Nexium have him call back and we can talk about changing his digoxin dose.

## 2014-01-08 MED ORDER — RANITIDINE HCL 150 MG PO TABS: 150.0000 mg | ORAL_TABLET | Freq: Two times a day (BID) | ORAL | Status: DC

## 2014-01-08 NOTE — Telephone Encounter (Signed)
Pt notified. Pt will try zantac and if it doesn't work he will call me back to discuss options.

## 2014-01-15 ENCOUNTER — Other Ambulatory Visit: Payer: Self-pay

## 2014-01-15 MED ORDER — DIGOXIN 250 MCG PO TABS: 250.0000 ug | ORAL_TABLET | Freq: Every day | ORAL | Status: DC

## 2014-02-27 ENCOUNTER — Telehealth: Payer: Self-pay | Admitting: Interventional Cardiology

## 2014-02-27 NOTE — Telephone Encounter (Signed)
Lm that Dr. Irish Lack does not have any appointments available but will forward to his Medical Assist Amy Stegall to try to find an available time.

## 2014-02-27 NOTE — Telephone Encounter (Signed)
New Message:  Pt is demanding to be seen by Dr. Irish Lack sometime during the week of May 25th. States he will go to another doctor if he needs to.... Pt is in the states of [place]  And will be back that week and needs to discuss his heart vavle with Dr. Irish Lack. Pt is requesting a call back from the nurse... Dr. Hassell Done schedule is full.

## 2014-03-01 NOTE — Telephone Encounter (Addendum)
Spoke with pts wife and she states pts SOB is better but pt has increased swelling in LE. I told pts wife he may need go to urgent care if symtpoms worsen or if pt feels in distress ( pt is in [place] until next Wednesday). Wife states she will let pt know. Appt made for 03/08/14.

## 2014-03-01 NOTE — Telephone Encounter (Signed)
Follow up      Patient wife calling from [place]. Wife is stating her husband does not know she calling.   In [place] last week -  C/O sob .ankle swollen .  Be in [place] next Wednesday. Does not have an upcoming appt with Dr.Varanasi.

## 2014-03-01 NOTE — Telephone Encounter (Signed)
Follow Up:  Pt is calling and is very upset. States he wants a new doctor. He is c/o that no one is calling him back and that we are playing with his health. Tried to calm the pt down. Pt wants to be seen  on the 25th by Dr. Irish Lack.Marland Kitchen Pt is out of town at the moment.. States he will be back at that time.

## 2014-03-01 NOTE — Telephone Encounter (Signed)
FYI to Dr. Varanasi.  

## 2014-03-08 ENCOUNTER — Encounter: Payer: Self-pay | Admitting: Interventional Cardiology

## 2014-03-08 ENCOUNTER — Ambulatory Visit (INDEPENDENT_AMBULATORY_CARE_PROVIDER_SITE_OTHER): Payer: No Typology Code available for payment source | Admitting: Interventional Cardiology

## 2014-03-08 VITALS — BP 100/70 | HR 92 | Ht 69.5 in | Wt 154.0 lb

## 2014-03-08 DIAGNOSIS — I4891 Unspecified atrial fibrillation: Secondary | ICD-10-CM

## 2014-03-08 DIAGNOSIS — I482 Chronic atrial fibrillation, unspecified: Secondary | ICD-10-CM

## 2014-03-08 DIAGNOSIS — I359 Nonrheumatic aortic valve disorder, unspecified: Secondary | ICD-10-CM

## 2014-03-08 DIAGNOSIS — R0602 Shortness of breath: Secondary | ICD-10-CM

## 2014-03-08 DIAGNOSIS — R609 Edema, unspecified: Secondary | ICD-10-CM

## 2014-03-08 LAB — BASIC METABOLIC PANEL
BUN: 21 mg/dL (ref 6–23)
CHLORIDE: 101 meq/L (ref 96–112)
CO2: 26 meq/L (ref 19–32)
CREATININE: 0.9 mg/dL (ref 0.4–1.5)
Calcium: 8.9 mg/dL (ref 8.4–10.5)
GFR: 87.2 mL/min (ref >60.00)
GLUCOSE: 90 mg/dL (ref 70–99)
Potassium: 4.1 mEq/L (ref 3.5–5.1)
Sodium: 134 mEq/L — ABNORMAL LOW (ref 135–145)

## 2014-03-08 LAB — BRAIN NATRIURETIC PEPTIDE: PRO B NATRI PEPTIDE: 233 pg/mL — AB (ref 0.0–100.0)

## 2014-03-08 NOTE — Patient Instructions (Addendum)
Your physician has requested that you have an echocardiogram. Echocardiography is a painless test that uses sound waves to create images of your heart. It provides your doctor with information about the size and shape of your heart and how well your heart's chambers and valves are working. This procedure takes approximately one hour. There are no restrictions for this procedure.  Your physician recommends that you return for lab work today for BNP and Bmet.   Your physician recommends that you schedule a follow-up appointment once you get a letter in the mail. You are due for your next appointment in December of 201.

## 2014-03-08 NOTE — Progress Notes (Signed)
Patient ID: Parthenia Ames, male   DOB: 07/07/1926, 81 y.o.   MRN: [id number]    [address], [place], [place]  [address] Phone: [phone] Fax:  [phone]  Date:  03/08/2014   ID:  John [name], DOB November 24, 1925, MRN [id number]  PCP:   Melinda Crutch, MD      History of Present Illness: John [name] is a 80 y.o. male with atrial fibrillation and significant aortic stenosis. He was in [place] and had some shortness of breath. He noticed both legs were swollen. This improved with elevating his legs and tying his shoelaces more loosely. His swelling is much better now. He notes some dyspnea on exertion. He has to walk shorter distances now before he stops. He denies any pain or pressure in his chest. He has not had lightheadedness or syncope. He denies any palpitations.   Wt Readings from Last 3 Encounters:  03/08/14 154 lb (69.854 kg)  01/02/14 162 lb (73.483 kg)  09/25/13 162 lb 6.4 oz (73.664 kg)     Past Medical History  Diagnosis Date  . Aortic stenosis     Current Outpatient Prescriptions  Medication Sig Dispense Refill  . APRISO 0.375 G 24 hr capsule Take 375 mg by mouth daily. For colitis      . ascorbic Acid (VITAMIN C) 500 MG CPCR Take 500 mg by mouth 2 (two) times daily.      Marland Kitchen BEE POLLEN PO Take 1,740 mg by mouth daily.      . digoxin (LANOXIN) 0.25 MG tablet Take 1 tablet (250 mcg total) by mouth daily.  30 tablet  3  . finasteride (PROSCAR) 5 MG tablet Take 5 mg by mouth daily.       . metoprolol tartrate (LOPRESSOR) 25 MG tablet Take 25 mg by mouth as needed.      . Multiple Vitamin (MULTIVITAMIN WITH MINERALS) TABS Take 1 tablet by mouth daily.      Marland Kitchen omeprazole (PRILOSEC) 20 MG capsule Take 20 mg by mouth daily.       . Potassium 99 MG TABS Take 99 mg by mouth daily.      Marland Kitchen warfarin (COUMADIN) 5 MG tablet Take 5 mg by mouth daily.      . Zinc 50 MG TABS Take 50 mg by mouth daily.      . [DISCONTINUED] metoprolol succinate (TOPROL-XL) 25 MG 24 hr tablet  Take 1 tablet (25 mg total) by mouth daily.  30 tablet  0   No current facility-administered medications for this visit.    Allergies:    Allergies  Allergen Reactions  . Percocet [Oxycodone-Acetaminophen]     Craziness, hallucinations.    Social History:  The patient  reports that he has never smoked. He does not have any smokeless tobacco history on file. He reports that he drinks alcohol.   Family History:  The patient's family history includes Hypertension (age of onset: 23) in his father.   ROS:  Please see the history of present illness.  No nausea, vomiting.  No fevers, chills.  No focal weakness.  No dysuria. Swelling, cough, dyspnea on exertion.  All other systems reviewed and negative.   PHYSICAL EXAM: VS:  BP 100/70  Pulse 92  Ht 5' 9.5" (1.765 m)  Wt 154 lb (69.854 kg)  BMI 22.42 kg/m2 Well nourished, well developed, in no acute distress HEENT: normal Neck: no JVD, no carotid bruits Cardiac:  normal S1, S2; RRR; 3/6 harsh systolic murmur Lungs:  clear to auscultation bilaterally, no wheezing, rhonchi or rales Abd: soft, nontender, no hepatomegaly Ext: no edema Skin: [place] and dry Neuro:   no focal abnormalities noted  EKG:  Atrial tachycardia with 2:1 conduction, rate controlled, left axis deviation    ASSESSMENT AND PLAN:  Atrial fibrillation   Started Metoprolol Tartrate Tablet, 25 MG, 1 tablet, Orally, Twice a day, 30 day(s), 60, Refills 11 at last visit . Notes: Heart rate increased when he skips doses. COumadin for stroke prevention.  He is using metoprolol as needed now. Not used for 2 months. Have explained the risks of untreated tachycardia to him in the past. He typically adjusts his meds as he pleases. 2. Aortic valve disorders  Notes: Severe AS by echo, but he has not had any CP or syncope. would consider TAVR if he develops symptoms. he did not pass out with his fall in [place].   3) shortness of breath: He had some lower extremity swelling. He also  had a cough while he was in [place]. Will check BNP to evaluate for fluid overload. We'll also recheck echocardiogram. Back in 2013, he had aortic stenosis with a mean gradient of 24 mm mercury. This could got worse which is now causing him to have dyspnea on exertion.  4) edema: Is likely due to venous insufficiency. Continue to elevate his legs. We could use compression stockings, but symptoms have improved at this point. Could also consider diuretics, but he would like to avoid medication if possible. If his BNP and shortness of breath attributable to fluid overload, then we will have to use diuretics.   Signed, Mina Marble, MD, [facility] 03/08/2014 3:02 PM

## 2014-03-12 ENCOUNTER — Telehealth: Payer: Self-pay | Admitting: Cardiology

## 2014-03-12 DIAGNOSIS — Z79899 Other long term (current) drug therapy: Secondary | ICD-10-CM

## 2014-03-12 MED ORDER — FUROSEMIDE 20 MG PO TABS: 20.0000 mg | ORAL_TABLET | Freq: Every day | ORAL | Status: DC

## 2014-03-12 NOTE — Telephone Encounter (Signed)
Pt notified. Rx sent in and Bmet ordered.

## 2014-03-12 NOTE — Telephone Encounter (Signed)
Message copied by Alcario Drought on Tue Mar 12, 2014  2:51 PM ------      Message from: Jettie Booze      Created: Tue Mar 12, 2014  2:43 PM       Mildly increase fluid level. Start Lasix 20 mg daily.BMet in one week. ------

## 2014-03-14 ENCOUNTER — Other Ambulatory Visit: Payer: Self-pay | Admitting: *Deleted

## 2014-03-14 MED ORDER — DIGOXIN 250 MCG PO TABS: 250.0000 ug | ORAL_TABLET | Freq: Every day | ORAL | Status: DC

## 2014-03-19 ENCOUNTER — Other Ambulatory Visit: Payer: No Typology Code available for payment source

## 2014-03-29 ENCOUNTER — Other Ambulatory Visit (HOSPITAL_COMMUNITY): Payer: No Typology Code available for payment source

## 2014-04-01 ENCOUNTER — Telehealth: Payer: Self-pay | Admitting: Interventional Cardiology

## 2014-04-01 NOTE — Telephone Encounter (Signed)
Spoke with pt and he has an appt with Dr. Tollie Eth on 04/04/14. He would like all of his records sent to Dr. Wynonia Lawman. Maudie Mercury, can you please send records to Dr. Wynonia Lawman. Including echo, stress tests, labs and OV.

## 2014-04-01 NOTE — Telephone Encounter (Signed)
Records were faxed to [place] @ [phone] For  Thursday Appt 6.18.15.       6.15.15/km

## 2014-04-01 NOTE — Telephone Encounter (Signed)
New Prob    Pt states records were supposed to be sent over to Dr. Thurman Coyer office about 2 weeks ago. States he spoke to a nurse regarding this. Pt calling to follow up on status. I connected pt with medical records today, wanted to make Dr. Irish Lack aware.

## 2014-04-01 NOTE — Telephone Encounter (Signed)
lmtrc

## 2014-04-04 ENCOUNTER — Encounter: Payer: Self-pay | Admitting: Cardiology

## 2014-04-04 DIAGNOSIS — I350 Nonrheumatic aortic (valve) stenosis: Secondary | ICD-10-CM | POA: Insufficient documentation

## 2014-04-04 DIAGNOSIS — Z7901 Long term (current) use of anticoagulants: Secondary | ICD-10-CM | POA: Insufficient documentation

## 2014-04-04 NOTE — Progress Notes (Unsigned)
Patient ID: John [name], male   DOB: 28-Sep-1926, 80 y.o.   MRN: [id number]   John [name]    Date of visit:  04/04/2014 DOB:  [birth year]    Age:  80 yrs. Medical record number:  [id number]     Account number:  [id number] Primary Care Provider: ROSS, C. ALAN ____________________________ CURRENT DIAGNOSES  1. Dyspnea  2. Aortic Valve-Stenosis  3. Arrhythmia-Atrial Fibrillation  4. Long-term (current) use of anticoagulants  5. GERD ____________________________ ALLERGIES  Percocet, Hallucinations ____________________________ MEDICATIONS  1. ascorbic acid 500 mg tablet, BID  2. bee pollen 550 mg capsule, 1740mg  qd  3. finasteride 5 mg tablet, 1 p.o. daily  4. multivitamin tablet, 1 p.o. daily  5. omeprazole 20 mg tablet,delayed release, 1 p.o. daily  6. potassium 99 mg tablet, 1 p.o. daily  7. warfarin 5 mg tablet, Take as directed  8. zinc 50 mg tablet, 1 p.o. daily  9. furosemide 20 mg tablet, 1 p.o. daily  10. digoxin 125 mcg tablet, 1 p.o. daily ____________________________ CHIEF COMPLAINTS  Changing cardiologist ____________________________ HISTORY OF PRESENT ILLNESS  This very nice 80 year old male is seen for evaluation of aortic stenosis, dyspnea and edema. The patient has previously been in very good health who was diagnosed with aortic stenosis and number of years ago. He had a catheterization in 2007 by Dr. Kendell Bane that showed mild aortic stenosis and underwent knee replacement that. He really did not have much significant coronary disease at that time. He has done well over the years and has been able to travel to [place] on an annual basis as well as go to [place]. He has been limited because of a foot drop on the left following back surgery and also has a somewhat unsteady gait and has to walk with the aid of a walking stick. Over the past 3 months he has noticed edema as well as some dyspnea with exertion. He has not had syncope and has not had angina. He will become dyspneic  after about one block and also became fatigues and dyspneic last month while [place] an aqu[place].  He wished to change cardiologists and came to see me. An echocardiogram yesterday showed a peak aortic gradient of 86 mm, a mean gradient of 54 mm, and valve area of 0.7 cm. There is also mild to moderate mitral regurgitation as well as mild to moderate elevation of pulmonary pressure with an estimated peak systolic pressure 46 mm. The only other issue is some diarrhea that he has had that has been fairly chronic. ____________________________ PAST HISTORY  Past Medical Illnesses:  GERD, chronic diarrhea, lumbar disc disease;  Cardiovascular Illnesses:  aortic stenosis, atrial fibrillation;  Surgical Procedures:  lumbar laminectomy, knee replacement-bil, shoulder surgery-rt;  Cardiology Procedures-Invasive:  cardiac cath (left) June 2007;  Cardiology Procedures-Noninvasive:  echocardiogram May 2015, echocardiogram June 2015;  LVEF of 60% documented via echocardiogram on 04/03/2014,   ____________________________ CARDIO-PULMONARY TEST DATES EKG Date:  04/04/2014;   Cardiac Cath Date:  07/01/2006;  Echocardiography Date: 04/03/2014;   ____________________________ FAMILY HISTORY Father -- Father dead, CVA Mother -- Mother dead, Death of unknown cause ____________________________ SOCIAL HISTORY Alcohol Use:  beer, wine and 1 q week;  Smoking:  never smoked;  Diet:  pt regulates diet due to diarrhea;  Lifestyle:  married;  Occupation:  Economist;  Residence:  lives with wife;   ____________________________ REVIEW OF SYSTEMS General:  denies recent weight change, fatique or change in exercise tolerance.  Integumentary:no rashes or new skin lesions. Eyes:  cataract extraction bilaterally Ears, Nose, Throat, Mouth:  denies any hearing loss, epistaxis, hoarseness or difficulty speaking. Respiratory: dyspnea with exertion Cardiovascular:  please review HPI Abdominal: diarrhea Genitourinary-Male: nocturia   Musculoskeletal:  denies arthritis, venous insufficiency, or muscle weakness Neurological:  ataxia Psychiatric:  denies depession or anxiety  ____________________________ PHYSICAL EXAMINATION VITAL SIGNS  Blood Pressure:  114/60 Sitting, Left arm, regular cuff  , 110/60 Standing, Left arm and regular cuff   Pulse:  58/min. Weight:  155.00 lbs. Height:  69"BMI: 23  Constitutional:  pleasant white male in no acute distress, well tanned, appears younger than stated age, walks with stick Skin:  [place] and dry to touch, no apparent skin lesions, or masses noted. Head:  normocephalic, normal hair pattern, no masses or tenderness Eyes:  EOMS Intact, PERRLA, C and S clear, Funduscopic exam not done. ENT:  ears, nose and throat reveal no gross abnormalities.  Dentition good. Neck:  supple, without massess. No JVD, thyromegaly or carotid bruits. Carotid upstroke normal. Chest:  normal symmetry, clear to auscultation. Cardiac:  regular rhythm, normal S1 and S2, no S3 or S4, grade 3/6 harsh systolic murmur at LSB, S2 diminished Abdomen:  abdomen soft,non-tender, no masses, no hepatospenomegaly, or aneurysm noted Peripheral Pulses:  the femoral,dorsalis pedis, and posterior tibial pulses are full and equal bilaterally with no bruits auscultated. Extremities & Back:  2+ edema Neurological:  foot drop on left, somewhat unsteady gait ____________________________ IMPRESSIONS/PLAN  1. Severe aortic stenosis with increase in aortic valve gradient since last echo and now with symptoms of dyspnea and edema 2. Previous lumbar disc surgery with left foot drop and unsteady gait 3. History of atrial fibrillation on warfarin therapy currently in sinus rhythm with PACs  Recommendations:  He has symptomatic severe aortic stenosis now. For some reason he is on a high dose of digoxin that should be reduced at his age. I have asked him to see Dr. [name] for consideration of TAVR versus surgical repair.  I spoke  to Dr. Burt Knack today who will contact him to get him seen soon. He should continue his furosemide but he should reduce his digoxin to 0.125 mg daily. ____________________________ TODAYS ORDERS  1. 12 Lead EKG: Today  2. Cardiology Consult Dr. Burt Knack: Schedule ASAP                       ____________________________ Cardiology Physician:  Kerry Hough MD [facility]

## 2014-04-09 ENCOUNTER — Telehealth: Payer: Self-pay | Admitting: Cardiovascular Disease

## 2014-04-09 NOTE — Telephone Encounter (Signed)
New message         Pt needs a TAVR consult asap with Dr Burt Knack per Dr Wynonia Lawman / can you work pt in?

## 2014-04-09 NOTE — Telephone Encounter (Signed)
Returned call to patient no answer.LMTC. 

## 2014-04-15 NOTE — Telephone Encounter (Signed)
Pt scheduled to see Dr Burt Knack on 04/17/14.

## 2014-04-16 ENCOUNTER — Other Ambulatory Visit: Payer: No Typology Code available for payment source

## 2014-04-17 ENCOUNTER — Ambulatory Visit (INDEPENDENT_AMBULATORY_CARE_PROVIDER_SITE_OTHER): Payer: No Typology Code available for payment source | Admitting: Cardiovascular Disease

## 2014-04-17 ENCOUNTER — Encounter: Payer: Self-pay | Admitting: Cardiovascular Disease

## 2014-04-17 ENCOUNTER — Ambulatory Visit (INDEPENDENT_AMBULATORY_CARE_PROVIDER_SITE_OTHER)
Admission: RE | Admit: 2014-04-17 | Discharge: 2014-04-17 | Disposition: A | Discharge status: Home / Self Care | Payer: No Typology Code available for payment source | Source: Ambulatory Visit | Attending: Cardiovascular Disease | Admitting: Cardiovascular Disease

## 2014-04-17 VITALS — BP 115/58 | HR 76 | Ht 69.5 in | Wt 152.8 lb

## 2014-04-17 DIAGNOSIS — I350 Nonrheumatic aortic (valve) stenosis: Secondary | ICD-10-CM

## 2014-04-17 DIAGNOSIS — I359 Nonrheumatic aortic valve disorder, unspecified: Secondary | ICD-10-CM

## 2014-04-17 NOTE — Progress Notes (Signed)
HPI:  80 year old gentleman referred by Dr. Wynonia Lawman for evaluation of severe symptomatic aortic stenosis and consideration of TAVR. The patient also has paroxysmal atrial fibrillation and he is chronically anticoagulated with warfarin.  The patient has enjoyed relatively good health over the years. He continues to work in [employer]. He is accompanied today by his daughter who lives in [place]. He's been married for 81 years and lives locally with his wife.  Over the past 6-12 months, the patient has developed progressive shortness of breath with activity. Symptoms occurred with less than one block of walking at a normal pace. The patient ambulates with a walking stick. He does have some gait instability, probably related to a foot drop after lumbar back surgery. He has not had any falls. The patient denies chest pain or pressure, lightheadedness, or syncope. After traveling to the [facility], the patient developed leg swelling and has been prescribed Lasix. His most recent echocardiogram confirms severe and progressive aortic stenosis with a mean and peak transaortic valve gradient of 54 and 86 mmHg, respectively.  The patient underwent cardiac catheterization in 2007 demonstrating diffuse nonobstructive coronary artery disease. At that time his aortic valve was noted to be calcified but only mildly stenotic based on hemodynamic data.  Outpatient Encounter Prescriptions as of 04/17/2014  Medication Sig  . ascorbic Acid (VITAMIN C) 500 MG CPCR Take 500 mg by mouth 2 (two) times daily.  Marland Kitchen BEE POLLEN PO Take 1,740 mg by mouth daily.  . digoxin (LANOXIN) 0.125 MG tablet Take 0.125 mg by mouth daily.  . finasteride (PROSCAR) 5 MG tablet Take 5 mg by mouth daily.   . metoprolol tartrate (LOPRESSOR) 25 MG tablet Take 25 mg by mouth as needed.  . Multiple Vitamin (MULTIVITAMIN WITH MINERALS) TABS Take 1 tablet by mouth daily.  Marland Kitchen omeprazole (PRILOSEC) 20 MG capsule Take 20 mg by mouth  daily.   . Potassium 99 MG TABS Take 99 mg by mouth daily.  Marland Kitchen warfarin (COUMADIN) 5 MG tablet Take 5 mg by mouth daily.  . Zinc 50 MG TABS Take 50 mg by mouth daily.  . [DISCONTINUED] APRISO 0.375 G 24 hr capsule Take 375 mg by mouth daily. For colitis  . [DISCONTINUED] digoxin (LANOXIN) 0.25 MG tablet Take 1 tablet (250 mcg total) by mouth daily.  . [DISCONTINUED] furosemide (LASIX) 20 MG tablet Take 1 tablet (20 mg total) by mouth daily.    Percocet  Past Medical History  Diagnosis Date  . Aortic stenosis   . Mixed hyperlipidemia 09/27/2013    Does not want to take statins   . History of left foot drop 01/02/2014  . GERD (gastroesophageal reflux disease)   . Lumbar disc disease   . BPH (benign prostatic hyperplasia)     Past Surgical History  Procedure Laterality Date  . Lumbar laminectomy    . Total knee arthroplasty Bilateral [id number]  . Cervical laminectomy  2009  . Shoulder surgery Right     History   Social History  . Marital Status: Married    Spouse Name: N/A    Number of Children: N/A  . Years of Education: N/A   Occupational History  . Not on file.   Social History Main Topics  . Smoking status: Never Smoker   . Smokeless tobacco: Never Used  . Alcohol Use: Yes  . Drug Use: Not on file  . Sexual Activity: Not on file   Other Topics Concern  . Not on file  Social History Narrative   He enjoys traveling.He uses social alcohol. He does not smoke.He tries to exercise  Regularly. He is employed in [employer].    Family History  Problem Relation Age of Onset  . Hypertension Father 39    ROS:  General: no fevers/chills/night sweats Eyes: no blurry vision, diplopia, or amaurosis ENT: no sore throat or hearing loss Resp: no cough, wheezing, or hemoptysis CV: no palpitations GI: no abdominal pain, nausea, vomiting, diarrhea, or constipation GU: no dysuria, frequency, or hematuria Skin: no rash Neuro: no headache, numbness, tingling, or  weakness of extremities Musculoskeletal: Positive for low back pain and balance problems Heme: no bleeding, DVT, or easy bruising Endo: no polydipsia or polyuria  BP 115/58  Pulse 76  Ht 5' 9.5" (1.765 m)  Wt 69.31 kg (152 lb 12.8 oz)  BMI 22.25 kg/m2  SpO2 98%  PHYSICAL EXAM: Pt is alert and oriented, WD, WN, elderly male in no distress. HEENT: normal Neck: JVP normal. Carotid upstrokes normal with soft bilateral bruits. No thyromegaly. Lungs: equal expansion, clear bilaterally CV: Apex is discrete and nondisplaced, irregular with grade 3/6 harsh late peaking systolic murmur at the left sternal border  Abd: soft, NT, +BS, no bruit, no hepatosplenomegaly Back: no CVA tenderness Ext: no C/C/E        DP/PT pulses intact and = Skin: Ground and dry without rash Neuro: CNII-XII intact             Strength intact = bilaterally  Noncontrasted chest CT 04/17/2014: FINDINGS:  Mediastinum: Heart size is normal. Trace amount of anterior  pericardial fluid and/or thickening, unlikely to be of hemodynamic  significance at this time, and similar to remote prior examination.  No associated pericardial calcification. There is atherosclerosis of  the thoracic aorta, the great vessels of the mediastinum and the  coronary arteries, including calcified atherosclerotic plaque in the  left main, left anterior descending, left circumflex and right  coronary arteries. Severe mitral annular calcifications. Severe  aortic valve calcifications. No pathologically enlarged mediastinal  or hilar lymph nodes. Please note that accurate exclusion of hilar  adenopathy is limited on noncontrast CT scans. Esophagus is  unremarkable in appearance.  Lungs/Pleura: 5 mm sessile nodule along the minor fissure (image 33  of series 3), unchanged in retrospect compared to prior study  03/16/2013, presumably a subpleural lymph node. Patchy areas of  peripheral predominant ground-glass attenuation and subtle  subpleural  reticulation throughout the lung bases bilaterally, most  evident in the right lower lobe, similar in extent to prior study  03/16/2013, potentially related to mild nonspecific interstitial  pneumonia (NSIP). Mild diffuse bronchial wall thickening. No acute  consolidative airspace disease. No pleural effusions.  Upper Abdomen: Low-attenuation lesions in the upper poles of the  kidneys bilaterally, incompletely visualized, compatible with cysts  as demonstrated on prior CT of the abdomen and pelvis 06/13/2013.  Musculoskeletal: There are no aggressive appearing lytic or blastic  lesions noted in the visualized portions of the skeleton.  IMPRESSION:  1. Severe calcifications of the aortic valve, compatible with the  reported history of aortic stenosis.  2. There is also severe calcification of the mitral annulus.  3. Atherosclerosis, including left main and 3 vessel coronary artery  disease.  4. The appearance of the lung parenchyma could suggest interstitial  lung disease such as mild nonspecific interstitial pneumonia (NSIP).  This appears similar to a prior study from 03/16/2013. Followup  evaluation with high-resolution chest CT in 1 year may  be of use to  ensure continued stability in appearance of the lung parenchyma to  exclude a progressively worsening process such as early usual  interstitial pneumonia (UIP).  5. Additional incidental findings, as above.   ASSESSMENT AND PLAN: 80 year old gentleman with severe symptomatic aortic stenosis. I have personally reviewed his echo images from Dr. Thurman Coyer office. The patient has a severely calcified, restricted aortic valve with severe stenosis and mild aortic insufficiency. He also has mild mitral regurgitation. LV function appears well preserved. His comorbid medical conditions include advanced age, gait instability and spinal stenosis, and atrial dysrhythmias on chronic anticoagulation. A noncontrasted chest CT was done in the office  today and this demonstrated atherosclerosis of the ascending aorta with severe aortic valve and mitral annular calcification. There was no evidence of severe aortic calcification.  I have reviewed the natural history and treatment options for severe symptomatic aortic stenosis. The patient understands the associated poor prognosis and expectation of progressive symptoms. Treatment options were reviewed in detail, including palliative medical therapy, surgical aortic valve replacement, and transcatheter aortic valve replacement. This discussion was placed in the context of his specific medical problems. I think the next step in evaluation is a right and left heart catheterization. I have reviewed the risks, indications, and alternatives with the patient and his daughter. He will hold warfarin beginning today and his cardiac catheterization will be scheduled for Monday, July 6. He will then be referred to cardiac surgery for formal consultation and further review of treatment options.  I have discussed his case with Dr Wynonia Lawman and appreciate the opportunity to participate in this nice gentleman's care.  [name] 04/17/2014 11:29 PM

## 2014-04-17 NOTE — Patient Instructions (Signed)
TAVR educational material given to the patient.  CT scan of CHEST without Contrast---Non-Cardiac CT scanning, (CAT scanning), is a noninvasive, special x-ray that produces cross-sectional images of the body using x-rays and a computer. CT scans help physicians diagnose and treat medical conditions. For some CT exams, a contrast material is used to enhance visibility in the area of the body being studied. CT scans provide greater clarity and reveal more details than regular x-ray exams.  Your physician has requested that you have a cardiac catheterization. Cardiac catheterization is used to diagnose and/or treat various heart conditions. Doctors may recommend this procedure for a number of different reasons. The most common reason is to evaluate chest pain. Chest pain can be a symptom of coronary artery disease (CAD), and cardiac catheterization can show whether plaque is narrowing or blocking your heart's arteries. This procedure is also used to evaluate the valves, as well as measure the blood flow and oxygen levels in different parts of your heart. For further information please visit [email or web address]. Please follow instruction sheet, as given.

## 2014-04-18 ENCOUNTER — Encounter (HOSPITAL_COMMUNITY): Payer: Self-pay | Admitting: Pharmacy Technician

## 2014-04-21 ENCOUNTER — Other Ambulatory Visit: Payer: Self-pay | Admitting: Cardiovascular Disease

## 2014-04-22 ENCOUNTER — Encounter (HOSPITAL_COMMUNITY)
Admission: RE | Disposition: A | Discharge status: Home / Self Care | Payer: Self-pay | Source: Ambulatory Visit | Attending: Cardiovascular Disease

## 2014-04-22 ENCOUNTER — Ambulatory Visit (HOSPITAL_COMMUNITY)
Admission: RE | Admit: 2014-04-22 | Discharge: 2014-04-22 | Disposition: A | Discharge status: Home / Self Care | Payer: Medicare Other | Source: Ambulatory Visit | Attending: Cardiovascular Disease | Admitting: Cardiovascular Disease

## 2014-04-22 DIAGNOSIS — I359 Nonrheumatic aortic valve disorder, unspecified: Secondary | ICD-10-CM | POA: Insufficient documentation

## 2014-04-22 DIAGNOSIS — Z7901 Long term (current) use of anticoagulants: Secondary | ICD-10-CM | POA: Insufficient documentation

## 2014-04-22 DIAGNOSIS — K219 Gastro-esophageal reflux disease without esophagitis: Secondary | ICD-10-CM | POA: Insufficient documentation

## 2014-04-22 DIAGNOSIS — I4891 Unspecified atrial fibrillation: Secondary | ICD-10-CM | POA: Insufficient documentation

## 2014-04-22 DIAGNOSIS — E782 Mixed hyperlipidemia: Secondary | ICD-10-CM | POA: Insufficient documentation

## 2014-04-22 DIAGNOSIS — N400 Enlarged prostate without lower urinary tract symptoms: Secondary | ICD-10-CM | POA: Insufficient documentation

## 2014-04-22 DIAGNOSIS — I2510 Atherosclerotic heart disease of native coronary artery without angina pectoris: Secondary | ICD-10-CM

## 2014-04-22 HISTORY — PX: LEFT AND RIGHT HEART CATHETERIZATION WITH CORONARY ANGIOGRAM: SHX5449

## 2014-04-22 LAB — CBC
HCT: 36.8 % — ABNORMAL LOW (ref 39.0–52.0)
HEMOGLOBIN: 12 g/dL — AB (ref 13.0–17.0)
MCH: 29.5 pg (ref 26.0–34.0)
MCHC: 32.6 g/dL (ref 30.0–36.0)
MCV: 90.4 fL (ref 78.0–100.0)
PLATELETS: 214 10*3/uL (ref 150–400)
RBC: 4.07 MIL/uL — AB (ref 4.22–5.81)
RDW: 17.5 % — ABNORMAL HIGH (ref 11.5–15.5)
WBC: 6.4 10*3/uL (ref 4.0–10.5)

## 2014-04-22 LAB — POCT I-STAT 3, ART BLOOD GAS (G3+)
Acid-Base Excess: 1 mmol/L (ref 0.0–2.0)
Bicarbonate: 25.2 mEq/L — ABNORMAL HIGH (ref 20.0–24.0)
O2 Saturation: 96 %
PCO2 ART: 37.1 mmHg (ref 35.0–45.0)
PO2 ART: 81 mmHg (ref 80.0–100.0)
TCO2: 26 mmol/L (ref 0–100)
pH, Arterial: 7.439 (ref 7.350–7.450)

## 2014-04-22 LAB — BASIC METABOLIC PANEL
Anion gap: 12 (ref 5–15)
BUN: 22 mg/dL (ref 6–23)
CHLORIDE: 101 meq/L (ref 96–112)
CO2: 26 mEq/L (ref 19–32)
Calcium: 9 mg/dL (ref 8.4–10.5)
Creatinine, Ser: 0.81 mg/dL (ref 0.50–1.35)
GFR calc non Af Amer: 78 mL/min — ABNORMAL LOW (ref >90)
Glucose, Bld: 94 mg/dL (ref 70–99)
POTASSIUM: 4 meq/L (ref 3.7–5.3)
SODIUM: 139 meq/L (ref 137–147)

## 2014-04-22 LAB — POCT I-STAT 3, VENOUS BLOOD GAS (G3P V)
Acid-Base Excess: 1 mmol/L (ref 0.0–2.0)
Acid-Base Excess: 2 mmol/L (ref 0.0–2.0)
Bicarbonate: 25.5 mEq/L — ABNORMAL HIGH (ref 20.0–24.0)
Bicarbonate: 26.5 mEq/L — ABNORMAL HIGH (ref 20.0–24.0)
O2 Saturation: 68 %
O2 Saturation: 69 %
PCO2 VEN: 42 mmHg — AB (ref 45.0–50.0)
PH VEN: 7.4 — AB (ref 7.250–7.300)
PH VEN: 7.408 — AB (ref 7.250–7.300)
PO2 VEN: 36 mmHg (ref 30.0–45.0)
TCO2: 27 mmol/L (ref 0–100)
TCO2: 28 mmol/L (ref 0–100)
pCO2, Ven: 41.1 mmHg — ABNORMAL LOW (ref 45.0–50.0)
pO2, Ven: 36 mmHg (ref 30.0–45.0)

## 2014-04-22 LAB — POCT ACTIVATED CLOTTING TIME: ACTIVATED CLOTTING TIME: 157 s

## 2014-04-22 LAB — PROTIME-INR
INR: 1.02 (ref 0.00–1.49)
PROTHROMBIN TIME: 13.4 s (ref 11.6–15.2)

## 2014-04-22 SURGERY — LEFT AND RIGHT HEART CATHETERIZATION WITH CORONARY ANGIOGRAM
Anesthesia: LOCAL

## 2014-04-22 MED ORDER — SODIUM CHLORIDE 0.9 % IV SOLN
INTRAVENOUS | Status: DC
  Administered 2014-04-22: 11:00:00 via INTRAVENOUS

## 2014-04-22 MED ORDER — SODIUM CHLORIDE 0.9 % IJ SOLN: 3.0000 mL | INTRAMUSCULAR | Status: DC | PRN

## 2014-04-22 MED ORDER — NITROGLYCERIN 0.2 MG/ML ON CALL CATH LAB
INTRAVENOUS | Status: AC
  Filled 2014-04-22: qty 1

## 2014-04-22 MED ORDER — ONDANSETRON HCL 4 MG/2ML IJ SOLN: 4.0000 mg | Freq: Four times a day (QID) | INTRAMUSCULAR | Status: DC | PRN

## 2014-04-22 MED ORDER — SODIUM CHLORIDE 0.9 % IJ SOLN: 3.0000 mL | Freq: Two times a day (BID) | INTRAMUSCULAR | Status: DC

## 2014-04-22 MED ORDER — SODIUM CHLORIDE 0.9 % IV SOLN: 1.0000 mL/kg/h | INTRAVENOUS | Status: DC

## 2014-04-22 MED ORDER — VERAPAMIL HCL 2.5 MG/ML IV SOLN
INTRAVENOUS | Status: AC
  Filled 2014-04-22: qty 2

## 2014-04-22 MED ORDER — HEPARIN SODIUM (PORCINE) 1000 UNIT/ML IJ SOLN
INTRAMUSCULAR | Status: AC
  Filled 2014-04-22: qty 1

## 2014-04-22 MED ORDER — MIDAZOLAM HCL 2 MG/2ML IJ SOLN
INTRAMUSCULAR | Status: AC
  Filled 2014-04-22: qty 2

## 2014-04-22 MED ORDER — LIDOCAINE HCL (PF) 1 % IJ SOLN
INTRAMUSCULAR | Status: AC
  Filled 2014-04-22: qty 30

## 2014-04-22 MED ORDER — HEPARIN (PORCINE) IN NACL 2-0.9 UNIT/ML-% IJ SOLN
INTRAMUSCULAR | Status: AC
  Filled 2014-04-22: qty 1500

## 2014-04-22 MED ORDER — SODIUM CHLORIDE 0.9 % IV SOLN: 250.0000 mL | INTRAVENOUS | Status: DC | PRN

## 2014-04-22 NOTE — Interval H&P Note (Signed)
History and Physical Interval Note:  04/22/2014 2:55 PM  John [name]  has presented today for surgery, with the diagnosis of aortic stenosis  The various methods of treatment have been discussed with the patient and family. After consideration of risks, benefits and other options for treatment, the patient has consented to  Procedure(s): LEFT AND RIGHT HEART CATHETERIZATION WITH CORONARY ANGIOGRAM (N/A) as a surgical intervention .  The patient's history has been reviewed, patient examined, no change in status, stable for surgery.  I have reviewed the patient's chart and labs.  Questions were answered to the patient's satisfaction.     [name]

## 2014-04-22 NOTE — H&P (View-Only) (Signed)
HPI:  80 year old gentleman referred by Dr. Wynonia Lawman for evaluation of severe symptomatic aortic stenosis and consideration of TAVR. The patient also has paroxysmal atrial fibrillation and he is chronically anticoagulated with warfarin.  The patient has enjoyed relatively good health over the years. He continues to work in [employer]. He is accompanied today by his daughter who lives in [place]. He's been married for 68 years and lives locally with his wife.  Over the past 6-12 months, the patient has developed progressive shortness of breath with activity. Symptoms occurred with less than one block of walking at a normal pace. The patient ambulates with a walking stick. He does have some gait instability, probably related to a foot drop after lumbar back surgery. He has not had any falls. The patient denies chest pain or pressure, lightheadedness, or syncope. After traveling to the [facility], the patient developed leg swelling and has been prescribed Lasix. His most recent echocardiogram confirms severe and progressive aortic stenosis with a mean and peak transaortic valve gradient of 54 and 86 mmHg, respectively.  The patient underwent cardiac catheterization in 2007 demonstrating diffuse nonobstructive coronary artery disease. At that time his aortic valve was noted to be calcified but only mildly stenotic based on hemodynamic data.  Outpatient Encounter Prescriptions as of 04/17/2014  Medication Sig  . ascorbic Acid (VITAMIN C) 500 MG CPCR Take 500 mg by mouth 2 (two) times daily.  Marland Kitchen BEE POLLEN PO Take 1,740 mg by mouth daily.  . digoxin (LANOXIN) 0.125 MG tablet Take 0.125 mg by mouth daily.  . finasteride (PROSCAR) 5 MG tablet Take 5 mg by mouth daily.   . metoprolol tartrate (LOPRESSOR) 25 MG tablet Take 25 mg by mouth as needed.  . Multiple Vitamin (MULTIVITAMIN WITH MINERALS) TABS Take 1 tablet by mouth daily.  Marland Kitchen omeprazole (PRILOSEC) 20 MG capsule Take 20 mg by mouth  daily.   . Potassium 99 MG TABS Take 99 mg by mouth daily.  Marland Kitchen warfarin (COUMADIN) 5 MG tablet Take 5 mg by mouth daily.  . Zinc 50 MG TABS Take 50 mg by mouth daily.  . [DISCONTINUED] APRISO 0.375 G 24 hr capsule Take 375 mg by mouth daily. For colitis  . [DISCONTINUED] digoxin (LANOXIN) 0.25 MG tablet Take 1 tablet (250 mcg total) by mouth daily.  . [DISCONTINUED] furosemide (LASIX) 20 MG tablet Take 1 tablet (20 mg total) by mouth daily.    Percocet  Past Medical History  Diagnosis Date  . Aortic stenosis   . Mixed hyperlipidemia 09/27/2013    Does not want to take statins   . History of left foot drop 01/02/2014  . GERD (gastroesophageal reflux disease)   . Lumbar disc disease   . BPH (benign prostatic hyperplasia)     Past Surgical History  Procedure Laterality Date  . Lumbar laminectomy    . Total knee arthroplasty Bilateral [id number]  . Cervical laminectomy  2009  . Shoulder surgery Right     History   Social History  . Marital Status: Married    Spouse Name: N/A    Number of Children: N/A  . Years of Education: N/A   Occupational History  . Not on file.   Social History Main Topics  . Smoking status: Never Smoker   . Smokeless tobacco: Never Used  . Alcohol Use: Yes  . Drug Use: Not on file  . Sexual Activity: Not on file   Other Topics Concern  . Not on file  Social History Narrative   He enjoys traveling.He uses social alcohol. He does not smoke.He tries to exercise  Regularly. He is employed in [employer].    Family History  Problem Relation Age of Onset  . Hypertension Father 52    ROS:  General: no fevers/chills/night sweats Eyes: no blurry vision, diplopia, or amaurosis ENT: no sore throat or hearing loss Resp: no cough, wheezing, or hemoptysis CV: no palpitations GI: no abdominal pain, nausea, vomiting, diarrhea, or constipation GU: no dysuria, frequency, or hematuria Skin: no rash Neuro: no headache, numbness, tingling, or  weakness of extremities Musculoskeletal: Positive for low back pain and balance problems Heme: no bleeding, DVT, or easy bruising Endo: no polydipsia or polyuria  BP 115/58  Pulse 76  Ht 5' 9.5" (1.765 m)  Wt 69.31 kg (152 lb 12.8 oz)  BMI 22.25 kg/m2  SpO2 98%  PHYSICAL EXAM: Pt is alert and oriented, WD, WN, elderly male in no distress. HEENT: normal Neck: JVP normal. Carotid upstrokes normal with soft bilateral bruits. No thyromegaly. Lungs: equal expansion, clear bilaterally CV: Apex is discrete and nondisplaced, irregular with grade 3/6 harsh late peaking systolic murmur at the left sternal border  Abd: soft, NT, +BS, no bruit, no hepatosplenomegaly Back: no CVA tenderness Ext: no C/C/E        DP/PT pulses intact and = Skin: [place] and dry without rash Neuro: CNII-XII intact             Strength intact = bilaterally  Noncontrasted chest CT 04/17/2014: FINDINGS:  Mediastinum: Heart size is normal. Trace amount of anterior  pericardial fluid and/or thickening, unlikely to be of hemodynamic  significance at this time, and similar to remote prior examination.  No associated pericardial calcification. There is atherosclerosis of  the thoracic aorta, the great vessels of the mediastinum and the  coronary arteries, including calcified atherosclerotic plaque in the  left main, left anterior descending, left circumflex and right  coronary arteries. Severe mitral annular calcifications. Severe  aortic valve calcifications. No pathologically enlarged mediastinal  or hilar lymph nodes. Please note that accurate exclusion of hilar  adenopathy is limited on noncontrast CT scans. Esophagus is  unremarkable in appearance.  Lungs/Pleura: 5 mm sessile nodule along the minor fissure (image 33  of series 3), unchanged in retrospect compared to prior study  03/16/2013, presumably a subpleural lymph node. Patchy areas of  peripheral predominant ground-glass attenuation and subtle  subpleural  reticulation throughout the lung bases bilaterally, most  evident in the right lower lobe, similar in extent to prior study  03/16/2013, potentially related to mild nonspecific interstitial  pneumonia (NSIP). Mild diffuse bronchial wall thickening. No acute  consolidative airspace disease. No pleural effusions.  Upper Abdomen: Low-attenuation lesions in the upper poles of the  kidneys bilaterally, incompletely visualized, compatible with cysts  as demonstrated on prior CT of the abdomen and pelvis 06/13/2013.  Musculoskeletal: There are no aggressive appearing lytic or blastic  lesions noted in the visualized portions of the skeleton.  IMPRESSION:  1. Severe calcifications of the aortic valve, compatible with the  reported history of aortic stenosis.  2. There is also severe calcification of the mitral annulus.  3. Atherosclerosis, including left main and 3 vessel coronary artery  disease.  4. The appearance of the lung parenchyma could suggest interstitial  lung disease such as mild nonspecific interstitial pneumonia (NSIP).  This appears similar to a prior study from 03/16/2013. Followup  evaluation with high-resolution chest CT in 1 year may  be of use to  ensure continued stability in appearance of the lung parenchyma to  exclude a progressively worsening process such as early usual  interstitial pneumonia (UIP).  5. Additional incidental findings, as above.   ASSESSMENT AND PLAN: 80 year old gentleman with severe symptomatic aortic stenosis. I have personally reviewed his echo images from Dr. Thurman Coyer office. The patient has a severely calcified, restricted aortic valve with severe stenosis and mild aortic insufficiency. He also has mild mitral regurgitation. LV function appears well preserved. His comorbid medical conditions include advanced age, gait instability and spinal stenosis, and atrial dysrhythmias on chronic anticoagulation. A noncontrasted chest CT was done in the office  today and this demonstrated atherosclerosis of the ascending aorta with severe aortic valve and mitral annular calcification. There was no evidence of severe aortic calcification.  I have reviewed the natural history and treatment options for severe symptomatic aortic stenosis. The patient understands the associated poor prognosis and expectation of progressive symptoms. Treatment options were reviewed in detail, including palliative medical therapy, surgical aortic valve replacement, and transcatheter aortic valve replacement. This discussion was placed in the context of his specific medical problems. I think the next step in evaluation is a right and left heart catheterization. I have reviewed the risks, indications, and alternatives with the patient and his daughter. He will hold warfarin beginning today and his cardiac catheterization will be scheduled for Monday, July 6. He will then be referred to cardiac surgery for formal consultation and further review of treatment options.  I have discussed his case with Dr Wynonia Lawman and appreciate the opportunity to participate in this nice gentleman's care.  [name] 04/17/2014 11:29 PM

## 2014-04-22 NOTE — Progress Notes (Signed)
Site area: Right brachial Site Prior to Removal:  Level 0  Pressure Applied For 20 MINUTES    Minutes Beginning at 1540  Manual:   Yes.    Patient Status During Pull:  stable  Post Pull Brahcial Site:  Level 0  Post Pull Instructions Given:  Yes.    Post Pull Pulses Present:  Yes.    Dressing Applied:  Yes.    Comments:  Pt stable during sheath pull.  Hemostatis achieved.  Pt denies any discomfort at this time.  Discharge instruction given to pt.on care of brachial and radial site

## 2014-04-22 NOTE — CV Procedure (Signed)
    Cardiac Catheterization Procedure Note  Name: John [name] MRN: [id number] DOB: 08/05/1926  Procedure: Right Heart Cath, Left Heart Cath, Selective Coronary Angiography, aortic root angiography  Indication: Severe aortic stenosis   Procedural Details: The right wrist and antecubital fossa were prepped, draped, and anesthetized with 1% lidocaine. Using the modified Seldinger technique a 5 French sheath was placed in the right radial artery and a 5 French sheath was placed in the right antecubital vein. A Swan-Ganz catheter was used for the right heart catheterization. Standard protocol was followed for recording of right heart pressures and sampling of oxygen saturations. Fick cardiac output was calculated. Standard Judkins catheters were used for selective coronary angiography and aortic root angiography. There were no immediate procedural complications. The patient was transferred to the post catheterization recovery area for further monitoring.  Procedural Findings: Hemodynamics RA 6 RV 29/6 PA 32/13 mean 21 PCWP 16 LV not recorded AO 115/57  Oxygen saturations: PA 69 AO 96 SVC 69    Cardiac Output (Fick) 5.6  Cardiac Index (Fick) 3.0   Coronary angiography: Coronary dominance: right  Left mainstem: Patent vessel with minimal distal disease noted  Left anterior descending (LAD): Patent to the distal anterior wall without significant obstructive disease. The first diagonal is small and severely diseased with 90% stenosis  Left circumflex (LCx): The intermediate branch is patent with luminal irregularity. The AV circumflex has diffuse nonobstructive disease without high-grade stenosis.   Right coronary artery (RCA): dominant, severely calcified vessel with diffuse disease. The proximal vessel has mild 20-30% stenosis. The mid-vessel has moderate 50-60% stenosis with heavy calcification. The distal vessel and PDA branch have no significant stenoses.   Aortic root  angiography: The aortic valve is severely calcified and restricted with 2+ AI.   On plain fluoroscopy, the mitral annulus is severely calcified.  Final Conclusions:   1. Moderate RCA stenosis 2. Severe stenosis of a small first diagonal branch 3. Nonobstructive LAD and LCx stenosis 4. Severe AS with mild-moderate AI  Recommendations: Pt scheduled to see Dr Cyndia Bent tomorrow for cardiac surgical evaluation. Further cardiac follow-up with Dr Wynonia Lawman.  [name] 04/22/2014, 2:57 PM

## 2014-04-22 NOTE — Discharge Instructions (Signed)
Radial Site Care °Refer to this sheet in the next few weeks. These instructions provide you with information on caring for yourself after your procedure. Your caregiver may also give you more specific instructions. Your treatment has been planned according to current medical practices, but problems sometimes occur. Call your caregiver if you have any problems or questions after your procedure. °HOME CARE INSTRUCTIONS °· You may shower the day after the procedure. Remove the bandage (dressing) and gently wash the site with plain soap and water. Gently pat the site dry. °· Do not apply powder or lotion to the site. °· Do not submerge the affected site in water for 3 to 5 days. °· Inspect the site at least twice daily. °· Do not flex or bend the affected arm for 24 hours. °· No lifting over 5 pounds (2.3 kg) for 5 days after your procedure. °· Do not drive home if you are discharged the same day of the procedure. Have someone else drive you. °· You may drive 24 hours after the procedure unless otherwise instructed by your caregiver. °· Do not operate machinery or power tools for 24 hours. °· A responsible adult should be with you for the first 24 hours after you arrive home. °What to expect: °· Any bruising will usually fade within 1 to 2 weeks. °· Blood that collects in the tissue (hematoma) may be painful to the touch. It should usually decrease in size and tenderness within 1 to 2 weeks. °SEEK IMMEDIATE MEDICAL CARE IF: °· You have unusual pain at the radial site. °· You have redness, warmth, swelling, or pain at the radial site. °· You have drainage (other than a small amount of blood on the dressing). °· You have chills. °· You have a fever or persistent symptoms for more than 72 hours. °· You have a fever and your symptoms suddenly get worse. °· Your arm becomes pale, cool, tingly, or numb. °· You have heavy bleeding from the site. Hold pressure on the site. °Document Released: 11/06/2010 Document Revised:  12/27/2011 Document Reviewed: 11/06/2010 °[employer]® Patient Information ©2015 [employer]. This information is not intended to replace advice given to you by your health care provider. Make sure you discuss any questions you have with your health care provider. ° °

## 2014-04-23 ENCOUNTER — Institutional Professional Consult (permissible substitution) (INDEPENDENT_AMBULATORY_CARE_PROVIDER_SITE_OTHER): Payer: Medicare Other | Admitting: Surgery

## 2014-04-23 ENCOUNTER — Encounter: Payer: Self-pay | Admitting: Surgery

## 2014-04-23 VITALS — BP 106/64 | HR 60 | Resp 20 | Ht 69.5 in | Wt 150.0 lb

## 2014-04-23 DIAGNOSIS — I359 Nonrheumatic aortic valve disorder, unspecified: Secondary | ICD-10-CM

## 2014-04-23 DIAGNOSIS — I350 Nonrheumatic aortic (valve) stenosis: Secondary | ICD-10-CM

## 2014-04-24 ENCOUNTER — Encounter: Payer: Self-pay | Admitting: Surgery

## 2014-04-24 DIAGNOSIS — I350 Nonrheumatic aortic (valve) stenosis: Secondary | ICD-10-CM | POA: Insufficient documentation

## 2014-04-24 NOTE — Progress Notes (Signed)
Patient ID: John [name], male   DOB: 03/29/1926, 80 y.o.   MRN: [id number]  John [name] SURGERY CONSULTATION REPORT  Referring Provider is Jacolyn Reedy, MD PCP is  Melinda Crutch, MD  Chief Complaint  Patient presents with  . Aortic Stenosis    Surgical eval for possible TAVR v/s AVR, Cardiac cath 04/22/2014, ECHO 04/03/14    HPI:  The patient is an 80 year old gentleman with a known history of aortic stenosis that has been followed over the years first by Dr. Doreatha Lew, then Dr. Irish Lack, and now Dr. Wynonia Lawman. An echo on 11/23/2011 showed moderate aortic stenosis with a peak velocity of 3.24 m/sec with a mean gradient of 24 mm Hg and a peak of 42 mm Hg. The AVA was 0.69 by vel and 0.77 by VTI with an EF of 68%. He has felt fairly well overall but notes development of some exertional shortness of breath and fatigue over the past year and is tired a lot of the time. 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 He says that he recently saw Dr. Wynonia Lawman and a followup echo was done which shows severe aortic stenosis with a peak velocity of 4.63 m/sec with a mean gradient of 54 mm Hg and a peak of 86 mm Hg. The AVA is 0.68 cm2 by Vmax. There is normal systolic function with moderate LVH. The EF is 55%. There is mild to moderate MR. The echo study was done through Dr. Thurman Coyer office so I have not seen the actual pictures. He was seen by Dr. Burt Knack and underwent cath on 04/22/2014 which shows a 50-60% mid RCA stenosis and a 90% small first diagonal stenosis. Right heart pressures were normal.  The patient lives with his wife of 40+ years. He walks with a "walking stick" due to significant balance issues and prior falls but his wife says he is suppose to use a walker. He has to avoid crowds because if someone bumps into him he will fall. He says this has been worked up and inner ear problems and stroke were ruled out.  He has undergone previous cervical spine decompression and arthrodesis/plating for cervical spinal stenosis by Dr. Sherley Bounds in 2009 and previous lumbar spinal decompression and fusion for spinal stenosis. A recent MRI of the lumbar spine shows severe stenosis at L1-2 and L4-5, L5-S-1 that were felt to be causing some neural compression and likely contributing to his balance problems. In addition he has a left foot drop since his prior lumbar spine surgery and wears a splint. He has also had bilateral knee replacements in 2008 and 2009.  Past Medical History  Diagnosis Date  . Aortic stenosis   . Mixed hyperlipidemia 09/27/2013    Does not want to take statins   . History of left foot drop 01/02/2014  . GERD (gastroesophageal reflux disease)   . Lumbar disc disease   . BPH (benign prostatic hyperplasia)     Past Surgical History  Procedure Laterality Date  . Lumbar laminectomy    . Total knee arthroplasty Bilateral [id number]  . Cervical laminectomy  2009  . Shoulder surgery Right     Family History  Problem Relation Age of Onset  . Hypertension Father 78    History   Social History  . Marital Status: Married    Spouse Name: N/A    Number of Children: N/A  . Years of Education: N/A  Occupational History  . Not on file.   Social History Main Topics  . Smoking status: Never Smoker   . Smokeless tobacco: Never Used  . Alcohol Use: Yes  . Drug Use: Not on file  . Sexual Activity: Not on file   Other Topics Concern  . Not on file   Social History Narrative   He enjoys traveling.He uses social alcohol. He does not smoke.He tries to exercise  Regularly. He is employed in [employer].    Current Outpatient Prescriptions  Medication Sig Dispense Refill  . ascorbic Acid (VITAMIN C) 500 MG CPCR Take 500 mg by mouth 2 (two) times daily.      Marland Kitchen BEE POLLEN PO Take 1,740 mg by mouth daily.      . digoxin (LANOXIN) 0.125 MG tablet Take 0.125 mg by mouth daily.      .  finasteride (PROSCAR) 5 MG tablet Take 5 mg by mouth daily.       . metoprolol tartrate (LOPRESSOR) 25 MG tablet Take 25 mg by mouth daily as needed (for increase heart rate).       . Multiple Vitamin (MULTIVITAMIN WITH MINERALS) TABS Take 1 tablet by mouth daily.      Marland Kitchen omeprazole (PRILOSEC) 20 MG capsule Take 20 mg by mouth daily.       . Potassium 99 MG TABS Take 99 mg by mouth daily.      Marland Kitchen warfarin (COUMADIN) 5 MG tablet Take 5 mg by mouth daily.      . Zinc 50 MG TABS Take 50 mg by mouth daily.      . [DISCONTINUED] metoprolol succinate (TOPROL-XL) 25 MG 24 hr tablet Take 1 tablet (25 mg total) by mouth daily.  30 tablet  0   No current facility-administered medications for this visit.    Allergies  Allergen Reactions  . Percocet [Oxycodone-Acetaminophen]     Craziness, hallucinations.      Review of Systems:   General:  normal appetite, decreased energy, no weight gain, no weight loss, no fever  Cardiac:  no chest pain with exertion, no chest pain at rest, mild SOB with mild exertion, no resting SOB, no PND, no orthopnea, has palpitations, h/o arrhythmia, h/o atrial fibrillation and has been in it for the past year on coumadin, mild LE edema, occasional dizzy spells, no syncope  Respiratory:  mild shortness of breath, no home oxygen, no productive cough, no dry cough, no bronchitis, no wheezing, no hemoptysis, no asthma, no pain with inspiration or cough, no sleep apnea, no CPAP at night  GI:   no difficulty swallowing, no reflux, no frequent heartburn, no hiatal hernia, no abdominal pain, no constipation, occasional diarrhea and history of mild colitis, no hematochezia, no hematemesis, no melena  GU:   no dysuria,  some frequency, no urinary tract infection, no hematuria, has enlarged prostate, no kidney stones, no kidney disease  Vascular:  no pain suggestive of claudication, no pain in feet, no leg cramps, no varicose veins, no DVT, no non-healing foot ulcer  Neuro:   no stroke,  no TIA's, no seizures, no headaches, notemporary blindness one eye,  no slurred speech, no peripheral neuropathy, no chronic pain, moderate instability of gait, no memory/cognitive dysfunction  Musculoskeletal: has arthritis, no joint swelling, no myalgias, moderate difficulty walking, good mobility   Skin:   no rash, no itching, no skin infections, no pressure sores or ulcerations  Psych:   no anxiety, no depression, no nervousness, no unusual recent stress  Eyes:   no blurry vision, no floaters, no recent vision changes,  wears glasses or contacts  ENT:   no hearing loss, no loose or painful teeth, no dentures, last saw dentist this year  Hematologic:  Has easy bruising on coumadin, no abnormal bleeding, no clotting disorder, no frequent epistaxis  Endocrine:  no diabetes, does not check CBG's at home           Physical Exam:   BP 106/64  Pulse 60  Resp 20  Ht 5' 9.5" (1.765 m)  Wt 150 lb (68.04 kg)  BMI 21.84 kg/m2  SpO2 96%  General:  Vibrant but elderly, frail gentleman in no distress  HEENT:  NCAT, PERLA, EOMI, oropharynx clear  Neck:   no JVD, no bruits, no adenopathy or thyromegaly, murmur transmitted to both sides of the neck  Chest:   clear to auscultation, symmetrical breath sounds, no wheezes, no rhonchi   CV:   IRRR, Harsh grade III/VI crescendo/decrescendo murmur heard best at RUSB,  no diastolic murmur  Abdomen:  soft, non-tender, no masses or organomegaly  Extremities:  [place], well-perfused, pulses diminished but palpable at DP and PT, no LE edema  Rectal/GU  Deferred  Neuro:   Grossly non-focal and symmetrical throughout, slow gait.  Skin:   Clean and dry, no rashes, no breakdown   Diagnostic Tests:  2D echo report form 04/03/2014 not in EPIC yet   *RADIOLOGY REPORT*  Clinical Data: Shortness of breath and recent travels to [place].  Left shoulder pain after fall.  CT ANGIOGRAPHY CHEST  Technique: Multidetector CT imaging of the chest using the  standard  protocol during bolus administration of intravenous  contrast. Multiplanar reconstructed images including MIPs were  obtained and reviewed to evaluate the vascular anatomy.  Contrast: 160mL OMNIPAQUE IOHEXOL 350 MG/ML SOLN  Comparison: Chest CT 12/07/2006  Findings: Negative for pulmonary embolism. The aortic valve is  calcified and there are mitral annular calcifications. Evidence of  coronary artery calcifications. No significant pericardial or  pleural fluid. No significant chest lymphadenopathy.  There is a edema in the left shoulder soft tissues and stranding in  the left axilla. Partially visualized low density structure in the  left kidney upper pole measures 4.1 cm and suggestive for a cyst.  Otherwise, upper abdominal structures are unremarkable.  The trachea and mainstem bronchi are patent. Again seen is a focal  area of pleural thickening along the right minor fissure. The  lungs are essentially clear without airspace disease or edema.  There are mild peripheral densities that could represent chronic  changes or atelectasis. There are old medial left lower rib  fractures. Left shoulder is located. No evidence for fracture  involving the visualized left humerus.  IMPRESSION:  Negative for pulmonary embolism.  No acute chest abnormality.  There is extensive edema and stranding in the left shoulder that  extends into the left axilla. Findings are concerning for a soft  tissue injury in this location.  Original Report Authenticated By: Markus Daft, M.D.    *RADIOLOGY REPORT*  Clinical Data: Fall 10 days ago. Left shoulder pain.  CT OF THE LEFT SHOULDER WITHOUT CONTRAST  Technique: Multidetector CT imaging was performed according to the  standard protocol. Multiplanar CT image reconstructions were also  generated.  Comparison: Radiographs 03/16/2013.  Findings: The glenohumeral joint is located. Moderate - severe  glenohumeral osteoarthritis is present. Type 2 acromion with    moderate to severe AC joint osteoarthritis. Chronic degenerative  remodeling along the undersurface  of the acromion in the superior  humeral head consistent with chronic rotator cuff tear. There is  severe fatty atrophy of subscapularis and the supraspinatus muscles  compatible with chronic rotator cuff tears. Fluid is present along  the distal torn subscapularis tendon, not unexpected in the setting  of full-thickness rotator cuff tear. Humeral head is high-riding.  Greater tuberosity and lesser tuberosity are intact. Visualized  clavicle is normal. No displaced rib fractures are seen. Scapula  intact. Glenohumeral effusion is likely degenerative.  IMPRESSION:  1. Moderate to severe glenohumeral and AC joint osteoarthritis.  2. Chronic rotator cuff tears of supraspinatus and subscapularis  with fatty atrophy.  3. No fracture or acute osseous abnormality identified.  Original Report Authenticated By: Dereck Ligas, M.D.    RADIOLOGY REPORT*  Clinical Data: Low back pain. Gait disturbance. Previous surgery.  MRI LUMBAR SPINE WITHOUT CONTRAST  Technique: Multiplanar and multiecho pulse sequences of the lumbar  spine were obtained without intravenous contrast.  Comparison: 09/17/2005  Findings: Scout images show multiple bilateral renal cysts, the  largest projecting laterally from the mid portion of the right  kidney measuring up to 810 cm in diameter. The largest cyst on the  left is at the upper pole measuring 4 cm in diameter. These are  not primarily or completely evaluated.  There is curvature convex to the left with the apex at L3.  T12-L1: Endplate osteophytes and bulging of the disc. Narrowing  of ventral subarachnoid space but no compression of the distal  cord. No compressive foraminal stenosis.  L1-2: Endplate osteophytes and bulging of the disc. Facet  hypertrophy right worse than left. The conus tip is at this level.  There is multifactorial canal stenosis at this  level that could be  symptomatic.  L2-3: Chronic disc degeneration with endplate osteophytes and  bulging of the disc. Facet and ligamentous hypertrophy. Severe  stenosis of the canal at this level that could cause neural  compression.  L3-4: Distant fusion and posterior decompression. Fusion appears  solid. The nerve roots appear clumped, suggesting arachnoiditis.  The canal and foramina are narrowed but there is probably no  ongoing neural compression at this level.  L4-5: Disc degeneration with endplate osteophytes and protrusion  of the disc more towards the right. Advanced facet arthropathy,  possibly with a pars defect on the right. Anterolisthesis of 2 mm.  Severe stenosis of the canal and foramina at this level that could  cause neural compression, particularly on the right.  L5-S1: Disc degeneration with broad-based herniation. Bilateral  facet degeneration and hypertrophy. Previous posterior  decompression. Narrowing of the lateral recesses and neural  foramina, left worse than right. Neural compression could occur,  particularly on the left.  Since the previous study, the stenoses have worsened at each level.  IMPRESSION:  Curvature convex to the left with the apex at L3.  Severe multifactorial stenosis at the L1-2 level that could cause  neural compression, more likely on the right.  L2-3: Severe multifactorial spinal stenosis that could cause  neural compression on either side.  L3-4: Previous fusion appears solid. No neural compression  suspected.  L4-5: Severe multifactorial spinal stenosis that could cause  neural compression, more pronounced on the right.  L5-S1: Spinal stenosis of the lateral recesses and foramina worse  on the left, where neural compression could occur.  Original Report Authenticated By: Nelson Chimes, M.D.    Cardiac Catheterization Procedure Note  Name: Heman Que  MRN: [id number]  DOB: 1925/11/17  Procedure:  Right Heart Cath, Left Heart  Cath, Selective Coronary Angiography, aortic root angiography  Indication: Severe aortic stenosis  Procedural Details: The right wrist and antecubital fossa were prepped, draped, and anesthetized with 1% lidocaine. Using the modified Seldinger technique a 5 French sheath was placed in the right radial artery and a 5 French sheath was placed in the right antecubital vein. A Swan-Ganz catheter was used for the right heart catheterization. Standard protocol was followed for recording of right heart pressures and sampling of oxygen saturations. Fick cardiac output was calculated. Standard Judkins catheters were used for selective coronary angiography and aortic root angiography. There were no immediate procedural complications. The patient was transferred to the post catheterization recovery area for further monitoring.  Procedural Findings:  Hemodynamics  RA 6  RV 29/6  PA 32/13 mean 21  PCWP 16  LV not recorded  AO 115/57  Oxygen saturations:  PA 69  AO 96  SVC 69  Cardiac Output (Fick) 5.6  Cardiac Index (Fick) 3.0  Coronary angiography:  Coronary dominance: right  Left mainstem: Patent vessel with minimal distal disease noted  Left anterior descending (LAD): Patent to the distal anterior wall without significant obstructive disease. The first diagonal is small and severely diseased with 90% stenosis  Left circumflex (LCx): The intermediate branch is patent with luminal irregularity. The AV circumflex has diffuse nonobstructive disease without high-grade stenosis.  Right coronary artery (RCA): dominant, severely calcified vessel with diffuse disease. The proximal vessel has mild 20-30% stenosis. The mid-vessel has moderate 50-60% stenosis with heavy calcification. The distal vessel and PDA branch have no significant stenoses.  Aortic root angiography: The aortic valve is severely calcified and restricted with 2+ AI.  On plain fluoroscopy, the mitral annulus is severely calcified.  Final  Conclusions:  1. Moderate RCA stenosis  2. Severe stenosis of a small first diagonal branch  3. Nonobstructive LAD and LCx stenosis  4. Severe AS with mild-moderate AI  Recommendations: Pt scheduled to see Dr Cyndia Bent tomorrow for cardiac surgical evaluation. Further cardiac follow-up with Dr Wynonia Lawman.  [name]  04/22/2014, 2:57 PM   STS Risk Calculator  Procedure: AV Replacement + CAB  Risk of Mortality: 3.508%  Morbidity or Mortality: 19.109%  Long Length of Stay: 7.617%  Short Length of Stay: 23.601%  Permanent Stroke: 1.834%  Prolonged Ventilation: 10.037%  DSW Infection: 0.215%  Renal Failure: 4.502%  Reoperation: 9.192%   Impression/Plan:  This gentleman is 49 and has severe symptomatic aortic stenosis with exertional shortness of breath and fatigue. He also has moderate RCA stenosis and high grade small diagonal stenosis. The coronary disease itself does not require treatment at this time and he has no angina. His STS risk of mortality for a conventional open AVR and bypass of the RCA is 3.5% but I think his operative risk would be significantly higher due to his advanced age combined with his severe spinal stenosis with significant balance issues and falls requiring a cane or walker for stabilization and a foot drop splint. He would have a very prolonged recovery and require ongoing physical therapy, likely a stay at a skilled nursing facility for rehab and may not get back to his current level of mobility. I think TAVR is a reasonable alternative for this patient who is really in the moderate to high risk category. I discussed the alternatives of open surgical AVR, TAVR , and continued medical therapy with him and his wife and answered all of their questions. They would like to be  considered for TAVR if possible. He has seen Dr. Burt Knack and I will scheduled a consult with Dr. Roxy Manns for a second surgical opinion. I will schedule a gated cardiac CT and CT of the abdomen and  pelvis as well as PFT's and physical therapy evaluation. After these are completed we will discuss his candidacy for TAVR among the heart valve team members.     Gaye Pollack, MD 04/23/2014

## 2014-04-25 ENCOUNTER — Other Ambulatory Visit: Payer: Self-pay | Admitting: *Deleted

## 2014-04-25 DIAGNOSIS — I359 Nonrheumatic aortic valve disorder, unspecified: Secondary | ICD-10-CM

## 2014-05-01 ENCOUNTER — Ambulatory Visit (HOSPITAL_COMMUNITY)
Admission: RE | Admit: 2014-05-01 | Discharge: 2014-05-01 | Disposition: A | Discharge status: Home / Self Care | Payer: Medicare Other | Source: Ambulatory Visit | Attending: Surgery | Admitting: Surgery

## 2014-05-01 ENCOUNTER — Ambulatory Visit: Payer: Medicare Other | Attending: Surgery | Admitting: Physical Therapy

## 2014-05-01 ENCOUNTER — Encounter (HOSPITAL_COMMUNITY): Payer: Self-pay

## 2014-05-01 DIAGNOSIS — K449 Diaphragmatic hernia without obstruction or gangrene: Secondary | ICD-10-CM | POA: Insufficient documentation

## 2014-05-01 DIAGNOSIS — R5383 Other fatigue: Secondary | ICD-10-CM | POA: Diagnosis not present

## 2014-05-01 DIAGNOSIS — N281 Cyst of kidney, acquired: Secondary | ICD-10-CM | POA: Insufficient documentation

## 2014-05-01 DIAGNOSIS — IMO0001 Reserved for inherently not codable concepts without codable children: Secondary | ICD-10-CM | POA: Diagnosis not present

## 2014-05-01 DIAGNOSIS — R5381 Other malaise: Secondary | ICD-10-CM | POA: Insufficient documentation

## 2014-05-01 DIAGNOSIS — I359 Nonrheumatic aortic valve disorder, unspecified: Secondary | ICD-10-CM

## 2014-05-01 DIAGNOSIS — I708 Atherosclerosis of other arteries: Secondary | ICD-10-CM | POA: Insufficient documentation

## 2014-05-01 DIAGNOSIS — N400 Enlarged prostate without lower urinary tract symptoms: Secondary | ICD-10-CM | POA: Insufficient documentation

## 2014-05-01 LAB — PULMONARY FUNCTION TEST
DL/VA % pred: 81 %
DL/VA: 3.67 ml/min/mmHg/L
DLCO COR % PRED: 64 %
DLCO COR: 19.98 ml/min/mmHg
DLCO UNC: 19.98 ml/min/mmHg
DLCO unc % pred: 64 %
FEF 25-75 PRE: 2.34 L/s
FEF 25-75 Post: 2.74 L/sec
FEF2575-%Change-Post: 16 %
FEF2575-%PRED-PRE: 150 %
FEF2575-%Pred-Post: 175 %
FEV1-%Change-Post: 5 %
FEV1-%PRED-PRE: 105 %
FEV1-%Pred-Post: 111 %
FEV1-Post: 2.75 L
FEV1-Pre: 2.62 L
FEV1FVC-%Change-Post: 3 %
FEV1FVC-%Pred-Pre: 111 %
FEV6-%CHANGE-POST: 2 %
FEV6-%PRED-POST: 102 %
FEV6-%PRED-PRE: 99 %
FEV6-PRE: 3.3 L
FEV6-Post: 3.39 L
FEV6FVC-%Change-Post: 1 %
FEV6FVC-%PRED-POST: 108 %
FEV6FVC-%Pred-Pre: 106 %
FVC-%CHANGE-POST: 1 %
FVC-%PRED-POST: 95 %
FVC-%Pred-Pre: 93 %
FVC-POST: 3.41 L
FVC-Pre: 3.36 L
POST FEV1/FVC RATIO: 81 %
POST FEV6/FVC RATIO: 100 %
Pre FEV1/FVC ratio: 78 %
Pre FEV6/FVC Ratio: 98 %
RV % pred: 103 %
RV: 2.84 L
TLC % PRED: 88 %
TLC: 6.12 L

## 2014-05-01 MED ORDER — NITROGLYCERIN 0.4 MG SL SUBL
SUBLINGUAL_TABLET | SUBLINGUAL | Status: AC
  Filled 2014-05-01: qty 1

## 2014-05-01 MED ORDER — IOHEXOL 350 MG/ML SOLN
100.0000 mL | Freq: Once | INTRAVENOUS | Status: AC | PRN
  Administered 2014-05-01: 100 mL via INTRAVENOUS

## 2014-05-01 MED ORDER — IOHEXOL 350 MG/ML SOLN
50.0000 mL | Freq: Once | INTRAVENOUS | Status: AC | PRN
  Administered 2014-05-01: 50 mL via INTRAVENOUS

## 2014-05-01 MED ORDER — ALBUTEROL SULFATE (2.5 MG/3ML) 0.083% IN NEBU
2.5000 mg | INHALATION_SOLUTION | Freq: Once | RESPIRATORY_TRACT | Status: AC
  Administered 2014-05-01: 2.5 mg via RESPIRATORY_TRACT

## 2014-05-01 MED ORDER — METOPROLOL TARTRATE 1 MG/ML IV SOLN
INTRAVENOUS | Status: AC
  Filled 2014-05-01: qty 5

## 2014-05-02 ENCOUNTER — Ambulatory Visit: Payer: No Typology Code available for payment source | Admitting: Surgery

## 2014-05-03 ENCOUNTER — Other Ambulatory Visit: Payer: Self-pay | Admitting: *Deleted

## 2014-05-03 ENCOUNTER — Ambulatory Visit (INDEPENDENT_AMBULATORY_CARE_PROVIDER_SITE_OTHER): Payer: No Typology Code available for payment source | Admitting: Surgery

## 2014-05-03 ENCOUNTER — Encounter: Payer: Self-pay | Admitting: Surgery

## 2014-05-03 VITALS — BP 109/60 | HR 67 | Resp 20 | Ht 69.5 in | Wt 150.0 lb

## 2014-05-03 DIAGNOSIS — I359 Nonrheumatic aortic valve disorder, unspecified: Secondary | ICD-10-CM

## 2014-05-03 DIAGNOSIS — I350 Nonrheumatic aortic (valve) stenosis: Secondary | ICD-10-CM

## 2014-05-03 NOTE — Progress Notes (Signed)
HPI:  The patient returns today to review the results of his gated cardiac CT and CTA of the chest, abdomen, and pelvis. He has been feeling about the same with mild exertional dyspnea. The gated cardiac CT shows that he would be a candidate for TAVR with an annular area of 514 cm2, probably suitable for a 29 mm Sapient XT valve with slight oversizing. Coronary height is adequate. The abdominal and pelvic CT has not been read by Dr. Weber Cooks but shows no significant obstructive disease of the abdominal aorta, iliac and common femoral arteries. There is significant tortuosity and calcification of the iliac arteries. PFT's look good.  Current Outpatient Prescriptions  Medication Sig Dispense Refill  . ascorbic Acid (VITAMIN C) 500 MG CPCR Take 500 mg by mouth 2 (two) times daily.      Marland Kitchen BEE POLLEN PO Take 1,740 mg by mouth daily.      . digoxin (LANOXIN) 0.125 MG tablet Take 0.125 mg by mouth daily.      . finasteride (PROSCAR) 5 MG tablet Take 5 mg by mouth daily.       . metoprolol tartrate (LOPRESSOR) 25 MG tablet Take 25 mg by mouth daily as needed (for increase heart rate).       . Multiple Vitamin (MULTIVITAMIN WITH MINERALS) TABS Take 1 tablet by mouth daily.      Marland Kitchen omeprazole (PRILOSEC) 20 MG capsule Take 20 mg by mouth daily.       . Potassium 99 MG TABS Take 99 mg by mouth daily.      Marland Kitchen warfarin (COUMADIN) 5 MG tablet Take 5 mg by mouth daily.      . Zinc 50 MG TABS Take 50 mg by mouth daily.      . predniSONE (STERAPRED UNI-PAK) 5 MG TABS tablet Take by mouth.      . [DISCONTINUED] metoprolol succinate (TOPROL-XL) 25 MG 24 hr tablet Take 1 tablet (25 mg total) by mouth daily.  30 tablet  0   No current facility-administered medications for this visit.     Physical Exam: BP 109/60  Pulse 67  Resp 20  Ht 5' 9.5" (1.765 m)  Wt 150 lb (68.04 kg)  BMI 21.84 kg/m2  SpO2 95%  Chest: clear to auscultation, symmetrical breath sounds, no wheezes, no rhonchi  CV: IRRR, Harsh  grade III/VI crescendo/decrescendo murmur heard best at RUSB, no diastolic murmur   Diagnostic Tests:  Luretha Rued, MD Wed May 01, 2014 3:31:53 PM EDT       ADDENDUM REPORT: 05/01/2014 15:29  ADDENDUM:  OVER-READ INTERPRETATION CT CHEST  The following report is an over-read performed by radiologist Dr.  Fonnie Birkenhead [facility] on Creation date. This  over-read does not include interpretation of cardiac or coronary  anatomy or pathology. The CTA interpretation by the cardiologist is  attached.  FINDINGS:  No pathologically enlarged mediastinal lymph nodes. Hilar regions  are difficult to definitively evaluate without IV contrast but  appear grossly unremarkable. No axillary adenopathy. Pulmonary  arteries are borderline enlarged. Atherosclerotic calcification of  the arterial vasculature. Heart is enlarged. No pericardial  effusion. Small hiatal hernia.  5 mm nodule along the minor fissure is unchanged from 03/16/2013 and  most consistent with a subpleural lymph node. Dependent atelectasis  in both lower lobes. Question mild subpleural reticulation. No  pleural fluid. Airway is unremarkable  Incidental imaging of the upper abdomen shows low attenuation  lesions arising from both kidneys, measuring  at least 6.4 cm on the  right, incompletely imaged. No worrisome lytic or sclerotic lesions.  IMPRESSION:  1. No acute findings.  2. Enlarged pulmonary arteries, indicative of pulmonary arterial  hypertension.  3. Suspect mild subpleural pulmonary fibrosis, as on 04/17/2014.  Electronically Signed  By: Lorin Picket M.D.  On: 05/01/2014 15:29       Study Result    CLINICAL DATA: Aortic Stenosis  EXAM:  Cardiac TAVR CT  TECHNIQUE:  The patient was scanned on a Philips 256 scanner. A 120 kV  retrospective scan was triggered in the descending thoracic aorta at  111 HU's. Gantry rotation speed was 270 msecs and collimation was .9  mm. No beta blockade or  nitro were given. The 3D data set was  reconstructed in 5% intervals of the R-R cycle. Systolic and  diastolic phases were analyzed on a dedicated work station using  MPR, MIP and VRT modes. The patient received 80 cc of contrast.  FINDINGS:  Aortic Valve: Trileaflet and heavily calcified There is minimal  calcification of the annulus at the base of the Non coronary and  right coronary cusps. There is also moderate subannular  calcification extending  To the base of the anterior mitral leaflet and moderate posterior  mitral annular calcification  Aorta: 3.5 cm  Sinotubular Junction: 3.0 cm  Sinus of Valsalva Measurements:  Non-coronary: 3.2 cm  Right -coronary: 3.1 cm  Left -coronary: 3.2 cm  Coronary Artery Height above Annulus:  Left Main: 13.6 mm  Right Coronary: 14.4 mm  Virtual Basal Annulus Measurements:  Maximum/Minimum Diameter: 29.9 mm x 20.9 mm  Perimeter: 81.7 mm  Area: 514 cm2  Coronary Arteries: Right dominant no anomalies Diffuse calcification  Suboptimal visualization due to afib. Mid RCA with at least moderate  calcific disease. First diagonal branch small and diffusely diseased  Optimum Fluoroscopic Angle for Delivery: LAO 7 degrees AP  IMPRESSION:  1) Severely calcified trileaflet Aortic valve suitable for an  underinflated 29 mm Sapien valve Area 514 cm2  2) Suitable coronary artery height for delivery with moderate mid  RCA disease and likely high grade D 1 disease  3) Optimal angiographic angle for delivery LAO 7 degrees Cranial 0  degrees  4) Minimal annulus calcification but moderate extension in  intervalvular fibrosa to base of anterior mitral leaflet and  posterior annular calcification  Jenkins Rouge  EXAM:  Cardiac CTA  MEDICATIONS:  Sub lingual nitro. 4mg  and lopressor mg  TECHNIQUE:  The patient was scanned on a Philips 528 slice scanner. Gantry  rotation speed was 270 msecs. Collimation was .2mm. A 100 kV  prospective scan was triggered in  the descending thoracic aorta at  111 HU's with 5% padding centered around 78% of the R-R interval.  Average HR during the scan was bpm. The 3D data set was interpreted  on a dedicated work station using MPR, MIP and VRT modes. A total of  80cc of contrast was used.  FINDINGS:  Non-cardiac: See separate report from [facility]. No  significant findings on limited lung and soft tissue windows.  Calcium Score:  Coronary Arteries: Right dominant with no anomalies  LM:  LAD:  D1:  D2:  Circumflex:  OM1:  OM2:  RCA:  PDA:  PLA:  IMPRESSION:  Jenkins Rouge  Electronically Signed:  By: Jenkins Rouge M.D.  On: 05/01/2014 13:17     CLINICAL DATA: Preoperative assessment of arterial anatomy prior to  planned TAVR procedure.  EXAM:  CTA ABDOMEN AND  PELVIS WITH CONTRAST  TECHNIQUE:  Multidetector CT imaging of the abdomen and pelvis was performed  using the standard protocol during bolus administration of  intravenous contrast. Multiplanar reconstructed images and MIPs were  obtained and reviewed to evaluate the vascular anatomy.  CONTRAST: 58mL OMNIPAQUE IOHEXOL 350 MG/ML SOLN  COMPARISON: CT of the abdomen and pelvis on 06/13/2013 at [facility].  FINDINGS:  The abdominal aorta shows no evidence of aneurysmal disease or  stenosis. Plaque is present primarily at visceral artery origins and  in the distal aorta without evidence of plaque ulceration or  penetrating ulcer. The celiac axis is normally patent. Minimal  plaque at the origin of the superior mesenteric artery does not  cause stenosis. Two separate left and 2 separate right renal  arteries originate in close proximity off of the aorta and show no  evidence of significant stenosis.  The common iliac arteries are calcified and tortuous bilaterally. No  significant stenosis is identified. Maximal diameter of the right  common iliac artery is 16 mm. Maximal diameter of the left common  iliac artery is 13 mm.  Bilateral hypogastric arterial trunks show  atherosclerosis without significant obstruction. There is mild  fusiform dilatation of the hypogastric trunks up to approximately 12  mm on the left and 14 mm on the right. The external iliac and common  femoral arteries demonstrate scattered calcified plaque without  significant stenosis.  Review of the MIP images confirms the above findings.  Nonvascular evaluation shows stable appearance of 9.7 cm upper pole  right and 4.5 cm upper pole left renal cysts which appear benign.  Unenhanced appearance of the liver, spleen, pancreas, gallbladder  and adrenal glands is unremarkable.  There is a small hiatal hernia. No masses or enlarged lymph nodes  are seen. No evidence of abnormal fluid collection or hernia. The  prostate gland is substantially enlarged with estimated dimensions  of approximately 6.0 x 6.3 x 6.5 cm. The upper prostate indents the  bladder. Bony structures show advanced degenerative disease of the  lumbar spine with fusion of L3 and L4 vertebral bodies. There is a  stable leftward convex scoliosis.  IMPRESSION:  1. No significant obstructive disease of the abdominal aorta, iliac  arteries or common femoral arteries. The iliac arteries are  tortuous, especially at the level of the common iliac arteries.  Hypogastric trunks show mild fusiform dilatation.  2. Stable renal cysts bilaterally.  3. Significant enlargement of the prostate gland.  Electronically Signed  By: Aletta Edouard M.D.  On: 05/02/2014 13:14    Impression:  This gentleman is 75 and has severe symptomatic aortic stenosis with exertional shortness of breath and fatigue. He also has moderate RCA stenosis and high grade small diagonal stenosis. The coronary disease itself does not require treatment at this time and he has no angina. His STS risk of mortality for a conventional open AVR and bypass of the RCA is 3.5% but I think his operative risk would be  significantly higher due to his advanced age combined with his severe spinal stenosis with significant balance issues and falls requiring a cane or walker for stabilization and a foot drop splint. He would have a very prolonged recovery and require ongoing physical therapy, likely a stay at a skilled nursing facility for rehab and may not get back to his current level of mobility. I think TAVR is a reasonable alternative for this patient who is really in the moderate to high risk category. I discussed the alternatives  of open surgical AVR, TAVR , and continued medical therapy with him and his wife and answered all of their questions. He is a candidate for a 29 mm Sapient XT valve and may be a candidate for transfemoral insertion. I will wait for Dr. Jonnie Kind reading of the abdominal and pelvic CT to make that determination. He has already seen Dr. Burt Knack and will see Dr. Roxy Manns in a few weeks when he returns from his vacation in [place].   During the course of the patient's preoperative work up they have been evaluated comprehensively by a multidisciplinary team of specialists coordinated through the [facility] in the [facility].  They have been demonstrated to suffer from symptomatic severe aortic stenosis as noted above. The patient has been counseled extensively as to the relative risks and benefits of all options for the treatment of severe aortic stenosis including long term medical therapy, conventional surgery for aortic valve replacement, and transcatheter aortic valve replacement.  Following the decision to proceed with transcatheter aortic valve replacement, a discussion has been held regarding what types of management strategies would be attempted intraoperatively in the event of life-threatening complications, including whether or not the patient would be considered a candidate for the use of cardiopulmonary bypass and/or conversion to open  sternotomy for attempted surgical intervention.  The patient has been advised of a variety of complications that might develop including but not limited to risks of death, stroke, paravalvular leak, aortic dissection or other major vascular complications, aortic annulus rupture, device embolization, cardiac rupture or perforation, mitral regurgitation, acute myocardial infarction, arrhythmia, heart block or bradycardia requiring permanent pacemaker placement, congestive heart failure, respiratory failure, renal failure, pneumonia, infection, other late complications related to structural valve deterioration or migration, or other complications that might ultimately cause a temporary or permanent loss of functional independence or other long term morbidity.  The patient provides full informed consent for the procedure as described and all questions were answered.   Plan:  He will be scheduled for TAVR on 06/04/2014.

## 2014-05-08 ENCOUNTER — Encounter: Payer: No Typology Code available for payment source | Admitting: Thoracic Surgery (Cardiothoracic Vascular Surgery)

## 2014-05-21 ENCOUNTER — Encounter (HOSPITAL_COMMUNITY): Payer: Self-pay | Admitting: Pharmacy Technician

## 2014-05-31 ENCOUNTER — Encounter (HOSPITAL_COMMUNITY): Payer: Self-pay

## 2014-05-31 ENCOUNTER — Ambulatory Visit (HOSPITAL_COMMUNITY)
Admission: RE | Admit: 2014-05-31 | Discharge: 2014-05-31 | Disposition: A | Discharge status: Home / Self Care | Payer: Medicare Other | Source: Ambulatory Visit | Attending: Cardiovascular Disease | Admitting: Cardiovascular Disease

## 2014-05-31 ENCOUNTER — Encounter (HOSPITAL_COMMUNITY)
Admission: RE | Admit: 2014-05-31 | Discharge: 2014-05-31 | Disposition: A | Discharge status: Home / Self Care | Payer: Medicare Other | Source: Ambulatory Visit | Attending: Cardiovascular Disease | Admitting: Cardiovascular Disease

## 2014-05-31 VITALS — BP 117/72 | HR 61 | Temp 97.4°F | Resp 18 | Ht 67.5 in | Wt 158.2 lb

## 2014-05-31 DIAGNOSIS — I359 Nonrheumatic aortic valve disorder, unspecified: Secondary | ICD-10-CM

## 2014-05-31 DIAGNOSIS — R0602 Shortness of breath: Secondary | ICD-10-CM | POA: Diagnosis present

## 2014-05-31 DIAGNOSIS — Z01811 Encounter for preprocedural respiratory examination: Secondary | ICD-10-CM | POA: Insufficient documentation

## 2014-05-31 HISTORY — DX: Cardiac murmur, unspecified: R01.1

## 2014-05-31 HISTORY — DX: Essential (primary) hypertension: I10

## 2014-05-31 HISTORY — DX: Cardiac arrhythmia, unspecified: I49.9

## 2014-05-31 HISTORY — DX: Personal history of urinary calculi: Z87.442

## 2014-05-31 LAB — APTT: APTT: 144 s — AB (ref 24–37)

## 2014-05-31 LAB — COMPREHENSIVE METABOLIC PANEL
ALBUMIN: 3.7 g/dL (ref 3.5–5.2)
ALT: 7 U/L (ref 0–53)
ANION GAP: 14 (ref 5–15)
AST: 18 U/L (ref 0–37)
Alkaline Phosphatase: 74 U/L (ref 39–117)
BILIRUBIN TOTAL: 0.5 mg/dL (ref 0.3–1.2)
BUN: 26 mg/dL — ABNORMAL HIGH (ref 6–23)
CHLORIDE: 100 meq/L (ref 96–112)
CO2: 23 mEq/L (ref 19–32)
CREATININE: 0.9 mg/dL (ref 0.50–1.35)
Calcium: 9.2 mg/dL (ref 8.4–10.5)
GFR calc Af Amer: 87 mL/min — ABNORMAL LOW (ref >90)
GFR calc non Af Amer: 75 mL/min — ABNORMAL LOW (ref >90)
Glucose, Bld: 115 mg/dL — ABNORMAL HIGH (ref 70–99)
Potassium: 4.5 mEq/L (ref 3.7–5.3)
Sodium: 137 mEq/L (ref 137–147)
TOTAL PROTEIN: 7.3 g/dL (ref 6.0–8.3)

## 2014-05-31 LAB — BLOOD GAS, ARTERIAL
Acid-Base Excess: 2.2 mmol/L — ABNORMAL HIGH (ref 0.0–2.0)
Bicarbonate: 26.3 mEq/L — ABNORMAL HIGH (ref 20.0–24.0)
DRAWN BY: 206361
FIO2: 0.21 %
O2 Saturation: 93.5 %
PCO2 ART: 41.6 mmHg (ref 35.0–45.0)
Patient temperature: 98.6
TCO2: 27.6 mmol/L (ref 0–100)
pH, Arterial: 7.418 (ref 7.350–7.450)
pO2, Arterial: 68.6 mmHg — ABNORMAL LOW (ref 80.0–100.0)

## 2014-05-31 LAB — CBC
HCT: 38.9 % — ABNORMAL LOW (ref 39.0–52.0)
Hemoglobin: 13 g/dL (ref 13.0–17.0)
MCH: 30.4 pg (ref 26.0–34.0)
MCHC: 33.4 g/dL (ref 30.0–36.0)
MCV: 91.1 fL (ref 78.0–100.0)
PLATELETS: 236 10*3/uL (ref 150–400)
RBC: 4.27 MIL/uL (ref 4.22–5.81)
RDW: 16.6 % — AB (ref 11.5–15.5)
WBC: 5.9 10*3/uL (ref 4.0–10.5)

## 2014-05-31 LAB — PROTIME-INR
INR: 2.15 — AB (ref 0.00–1.49)
Prothrombin Time: 24 seconds — ABNORMAL HIGH (ref 11.6–15.2)

## 2014-05-31 LAB — HEMOGLOBIN A1C
HEMOGLOBIN A1C: 5.7 % — AB (ref <5.7)
MEAN PLASMA GLUCOSE: 117 mg/dL — AB (ref <117)

## 2014-05-31 LAB — SURGICAL PCR SCREEN
MRSA, PCR: NEGATIVE
Staphylococcus aureus: NEGATIVE

## 2014-05-31 NOTE — Pre-Procedure Instructions (Addendum)
John [name]  05/31/2014   Your procedure is scheduled on:  06/04/14  Report to [facility] short stay admitting  at 530 AM.  Call this number if you have problems the morning of surgery: [phone]   Remember:   Do not eat food or drink liquids after midnight.   Take these medicines the morning of surgery with A SIP OF WATER: digoxin, proscar, metoprolol, omeprazole     Take all meds as ordered until day of surgery except as instructed below or per dr   John [name] all herbel meds, nsaids (aleve,naproxen,advil,ibuprofen) now including vitamins,bee pollen, zinc,     Coumadin per dr   John [name] not wear jewelry, make-up or nail polish.  Do not wear lotions, powders, or perfumes. You may wear deodorant.  Do not shave 48 hours prior to surgery. Men may shave face and neck.  Do not bring valuables to the hospital.  [facility] is not responsible                  for any belongings or valuables.               Contacts, dentures or bridgework may not be worn into surgery.  Leave suitcase in the car. After surgery it may be brought to your room.  For patients admitted to the hospital, discharge time is determined by your                treatment team.               Patients discharged the day of surgery will not be allowed to drive  home.  Name and phone number of your driver:   Special Instructions:  Special Instructions: [facility] - Preparing for Surgery  Before surgery, you can play an important role.  Because skin is not sterile, your skin needs to be as free of germs as possible.  You can reduce the number of germs on you skin by washing with CHG (chlorahexidine gluconate) soap before surgery.  CHG is an antiseptic cleaner which kills germs and bonds with the skin to continue killing germs even after washing.  Please DO NOT use if you have an allergy to CHG or antibacterial soaps.  If your skin becomes reddened/irritated stop using the CHG and inform your nurse when you arrive at Short Stay.  Do  not shave (including legs and underarms) for at least 48 hours prior to the first CHG shower.  You may shave your face.  Please follow these instructions carefully:   1.  Shower with CHG Soap the night before surgery and the morning of Surgery.  2.  If you choose to wash your hair, wash your hair first as usual with your normal shampoo.  3.  After you shampoo, rinse your hair and body thoroughly to remove the Shampoo.  4.  Use CHG as you would any other liquid soap.  You can apply chg directly  to the skin and wash gently with scrungie or a clean washcloth.  5.  Apply the CHG Soap to your body ONLY FROM THE NECK DOWN.  Do not use on open wounds or open sores.  Avoid contact with your eyes ears, mouth and genitals (private parts).  Wash genitals (private parts)       with your normal soap.  6.  Wash thoroughly, paying special attention to the area where your surgery will be performed.  7.  Thoroughly rinse your body with [place] water from the  neck down.  8.  DO NOT shower/wash with your normal soap after using and rinsing off the CHG Soap.  9.  Pat yourself dry with a clean towel.            10.  Wear clean pajamas.            11.  Place clean sheets on your bed the night of your first shower and do not sleep with pets.  Day of Surgery  Do not apply any lotions/deodorants the morning of surgery.  Please wear clean clothes to the hospital/surgery center.   Please read over the following fact sheets that you were given: Pain Booklet, Coughing and Deep Breathing, Blood Transfusion Information, Open Heart Packet, MRSA Information and Surgical Site Infection Prevention

## 2014-05-31 NOTE — Progress Notes (Addendum)
Dr Cyndia Bent office called re:  Sunburn on lft foot. Burned last Thursday and seen and being treated with cream by family dr Melinda Crutch  Message left Dr cooper office called re: elevated ptt. Spoke with lauren also informed her of burn on foot.

## 2014-06-03 ENCOUNTER — Other Ambulatory Visit: Payer: Self-pay

## 2014-06-03 ENCOUNTER — Encounter: Payer: Self-pay | Admitting: Thoracic Surgery (Cardiothoracic Vascular Surgery)

## 2014-06-03 ENCOUNTER — Institutional Professional Consult (permissible substitution) (INDEPENDENT_AMBULATORY_CARE_PROVIDER_SITE_OTHER): Payer: Medicare Other | Admitting: Thoracic Surgery (Cardiothoracic Vascular Surgery)

## 2014-06-03 VITALS — BP 108/66 | HR 64 | Ht 67.5 in | Wt 158.0 lb

## 2014-06-03 DIAGNOSIS — I350 Nonrheumatic aortic (valve) stenosis: Secondary | ICD-10-CM

## 2014-06-03 DIAGNOSIS — I359 Nonrheumatic aortic valve disorder, unspecified: Secondary | ICD-10-CM

## 2014-06-03 MED ORDER — POTASSIUM CHLORIDE 2 MEQ/ML IV SOLN
80.0000 meq | INTRAVENOUS | Status: DC
  Filled 2014-06-03: qty 40

## 2014-06-03 MED ORDER — DEXTROSE 5 % IV SOLN
1.5000 g | INTRAVENOUS | Status: AC
  Administered 2014-06-04: 1.5 g via INTRAVENOUS
  Filled 2014-06-03 (×2): qty 1.5

## 2014-06-03 MED ORDER — PHENYLEPHRINE HCL 10 MG/ML IJ SOLN
30.0000 ug/min | INTRAVENOUS | Status: AC
  Administered 2014-06-04: 25 ug/min via INTRAVENOUS
  Filled 2014-06-03: qty 2

## 2014-06-03 MED ORDER — NITROGLYCERIN IN D5W 200-5 MCG/ML-% IV SOLN
2.0000 ug/min | INTRAVENOUS | Status: AC
  Administered 2014-06-04: 5 ug/min via INTRAVENOUS
  Filled 2014-06-03: qty 250

## 2014-06-03 MED ORDER — DOPAMINE-DEXTROSE 3.2-5 MG/ML-% IV SOLN
2.0000 ug/kg/min | INTRAVENOUS | Status: DC
Start: 2014-06-04 — End: 2014-06-04
  Filled 2014-06-03: qty 250

## 2014-06-03 MED ORDER — METOPROLOL TARTRATE 12.5 MG HALF TABLET
12.5000 mg | ORAL_TABLET | Freq: Once | ORAL | Status: AC
  Administered 2014-06-04: 12.5 mg via ORAL
  Filled 2014-06-03: qty 1

## 2014-06-03 MED ORDER — HEPARIN SODIUM (PORCINE) 1000 UNIT/ML IJ SOLN
INTRAMUSCULAR | Status: DC
  Filled 2014-06-03: qty 30

## 2014-06-03 MED ORDER — SODIUM CHLORIDE 0.9 % IV SOLN: INTRAVENOUS | Status: DC

## 2014-06-03 MED ORDER — CHLORHEXIDINE GLUCONATE 4 % EX LIQD
30.0000 mL | CUTANEOUS | Status: DC
  Filled 2014-06-03: qty 30

## 2014-06-03 MED ORDER — MAGNESIUM SULFATE 50 % IJ SOLN
40.0000 meq | INTRAMUSCULAR | Status: DC
  Filled 2014-06-03: qty 10

## 2014-06-03 MED ORDER — VANCOMYCIN HCL 10 G IV SOLR
1250.0000 mg | INTRAVENOUS | Status: DC
  Filled 2014-06-03: qty 1250

## 2014-06-03 MED ORDER — DEXMEDETOMIDINE HCL IN NACL 400 MCG/100ML IV SOLN
0.1000 ug/kg/h | INTRAVENOUS | Status: AC
  Administered 2014-06-04: 0.3 ug/kg/h via INTRAVENOUS
  Filled 2014-06-03: qty 100

## 2014-06-03 MED ORDER — DEXTROSE 5 % IV SOLN
0.5000 ug/min | INTRAVENOUS | Status: DC
Start: 2014-06-04 — End: 2014-06-04
  Filled 2014-06-03: qty 4

## 2014-06-03 MED ORDER — SODIUM CHLORIDE 0.9 % IV SOLN
INTRAVENOUS | Status: DC
  Filled 2014-06-03: qty 1

## 2014-06-03 MED ORDER — NOREPINEPHRINE BITARTRATE 1 MG/ML IV SOLN
0.0000 ug/min | INTRAVENOUS | Status: DC
  Filled 2014-06-03: qty 8

## 2014-06-03 NOTE — Anesthesia Preprocedure Evaluation (Addendum)
Anesthesia Evaluation  Patient identified by MRN, date of birth, ID band Patient awake    Reviewed: Allergy & Precautions, H&P , NPO status , Patient's Chart, lab work & pertinent test results, reviewed documented beta blocker date and time   History of Anesthesia Complications Negative for: history of anesthetic complications  Airway Mallampati: II TM Distance: >3 FB Neck ROM: Full    Dental  (+) Teeth Intact, Dental Advisory Given   Pulmonary neg pulmonary ROS,  breath sounds clear to auscultation        Cardiovascular hypertension, Pt. on medications and Pt. on home beta blockers - angina+ CAD (mod RCA disease, D1 disease) + dysrhythmias Atrial Fibrillation + Valvular Problems/Murmurs (severe AS, AVA 0.67cm2, mild-mod MR) AS and MR Rhythm:Irregular Rate:Normal  6/15 ECHO: mod LVH with EF 55%, severe AS, AVA 0.67cm2, peak grad 85.6 mmHg, mean grad 54.1 mmHg, mild-mod MR, mod TR   Neuro/Psych L foot drop: s/p lum lam    GI/Hepatic Neg liver ROS, GERD-  Medicated and Controlled,  Endo/Other  negative endocrine ROS  Renal/GU negative Renal ROS     Musculoskeletal   Abdominal   Peds  Hematology  (+) Blood dyscrasia (coumadin, d/c 8/12), ,   Anesthesia Other Findings   Reproductive/Obstetrics                       Anesthesia Physical Anesthesia Plan  ASA: IV  Anesthesia Plan: General   Post-op Pain Management:    Induction: Intravenous  Airway Management Planned: Oral ETT  Additional Equipment: Arterial line, PA Cath, 3D TEE and Ultrasound Guidance Line Placement  Intra-op Plan:   Post-operative Plan: Possible Post-op intubation/ventilation  Informed Consent: I have reviewed the patients History and Physical, chart, labs and discussed the procedure including the risks, benefits and alternatives for the proposed anesthesia with the patient or authorized representative who has indicated  his/her understanding and acceptance.   Dental advisory given  Plan Discussed with: CRNA and Surgeon  Anesthesia Plan Comments: (Plan routine monitors, A line, PA cath, GETA with possible post op ventilation)       Anesthesia Quick Evaluation

## 2014-06-03 NOTE — Progress Notes (Signed)
Anesthesia Chart Review:  Patient is a 80 year old male scheduled for TAVR, transfemoral approach on 06/04/14 by Dr. Burt Knack. He has already been evaluated by CT surgeon Dr. Cyndia Bent, and is scheduled for a second surgical opinion with Dr. Roxy Manns this morning.   History includes severe AS, HTN, afib, CAD, GERD, left foot drop s/p lumbar laminectomy, non-smoker, HLD, nephrolithiasis, BPH, bilateral TKA. PCP is Dr. Melinda Crutch. Primary cardiologist is Dr. Wynonia Lawman.    EKG on 05/31/14 showed: afib, occasional PVCs, LAD, possible anterior infarct (age undetermined).  Cardiac cath on 04/22/14 showed: Coronary angiography:  Coronary dominance: right  Left mainstem: Patent vessel with minimal distal disease noted  Left anterior descending (LAD): Patent to the distal anterior wall without significant obstructive disease. The first diagonal is small and severely diseased with 90% stenosis  Left circumflex (LCx): The intermediate branch is patent with luminal irregularity. The AV circumflex has diffuse nonobstructive disease without high-grade stenosis.  Right coronary artery (RCA): dominant, severely calcified vessel with diffuse disease. The proximal vessel has mild 20-30% stenosis. The mid-vessel has moderate 50-60% stenosis with heavy calcification. The distal vessel and PDA branch have no significant stenoses.  Aortic root angiography: The aortic valve is severely calcified and restricted with 2+ AI.  On plain fluoroscopy, the mitral annulus is severely calcified.  Final Conclusions:  1. Moderate RCA stenosis  2. Severe stenosis of a small first diagonal branch  3. Nonobstructive LAD and LCx stenosis  4. Severe AS with mild-moderate AI   Echo on 04/03/14 showed: Severe AS with mild AR. AV peak gradient 85.6 mmHg. AV mean gradient 54.1 mmHg. AVA (Vmax) 0.67 cm2. Moderate concentric LVH with normal EF 55%. Marked left atrial enlargement. Mild to moderate mitral regurgitation. Mild to moderate tricuspid  regurgitation with mild to moderate elevation of RVSP.  Preoperative coronary CT and CTA of the chest/ abd/pelvis on 05/01/14 noted in Epic.    CXR on 05/31/14 showed: No active cardiopulmonary disease.  Preoperative PFTs and labs noted.  PT/INR elevated.  Coumadin held starting 05/29/14.  PTT was elevated at 144, unclear etiology as heparin or Lovenox are not listed on his medication list.  His PAT RN already called the result to Dr. Antionette Char office.  He will need repeat PT/PTT on arrival.  George Hugh [facility] Short Stay Center/Anesthesiology Phone [phone] 06/03/2014 9:54 AM
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
## 2014-06-04 ENCOUNTER — Encounter (HOSPITAL_COMMUNITY): Payer: Medicare Other | Admitting: Anesthesiology

## 2014-06-04 ENCOUNTER — Encounter (HOSPITAL_COMMUNITY)
Admission: RE | Disposition: A | Discharge status: Home / Self Care | Payer: Medicare Other | Source: Ambulatory Visit | Attending: Cardiovascular Disease

## 2014-06-04 ENCOUNTER — Inpatient Hospital Stay (HOSPITAL_COMMUNITY)
Admission: RE | Admit: 2014-06-04 | Discharge: 2014-06-06 | DRG: 267 | Disposition: A | Discharge status: Home / Self Care | Payer: Medicare Other | Source: Ambulatory Visit | Attending: Cardiovascular Disease | Admitting: Cardiovascular Disease

## 2014-06-04 ENCOUNTER — Inpatient Hospital Stay (HOSPITAL_COMMUNITY): Payer: Medicare Other | Admitting: Anesthesiology

## 2014-06-04 ENCOUNTER — Encounter (HOSPITAL_COMMUNITY): Payer: Self-pay | Admitting: *Deleted

## 2014-06-04 ENCOUNTER — Inpatient Hospital Stay (HOSPITAL_COMMUNITY): Payer: Medicare Other

## 2014-06-04 DIAGNOSIS — I359 Nonrheumatic aortic valve disorder, unspecified: Principal | ICD-10-CM | POA: Diagnosis present

## 2014-06-04 DIAGNOSIS — Z87442 Personal history of urinary calculi: Secondary | ICD-10-CM

## 2014-06-04 DIAGNOSIS — I350 Nonrheumatic aortic (valve) stenosis: Secondary | ICD-10-CM | POA: Diagnosis present

## 2014-06-04 DIAGNOSIS — Z79899 Other long term (current) drug therapy: Secondary | ICD-10-CM | POA: Diagnosis not present

## 2014-06-04 DIAGNOSIS — Z7901 Long term (current) use of anticoagulants: Secondary | ICD-10-CM

## 2014-06-04 DIAGNOSIS — Z006 Encounter for examination for normal comparison and control in clinical research program: Secondary | ICD-10-CM | POA: Diagnosis not present

## 2014-06-04 DIAGNOSIS — I10 Essential (primary) hypertension: Secondary | ICD-10-CM | POA: Diagnosis present

## 2014-06-04 DIAGNOSIS — I4821 Permanent atrial fibrillation: Secondary | ICD-10-CM | POA: Diagnosis present

## 2014-06-04 DIAGNOSIS — Z8249 Family history of ischemic heart disease and other diseases of the circulatory system: Secondary | ICD-10-CM | POA: Diagnosis not present

## 2014-06-04 DIAGNOSIS — M216X9 Other acquired deformities of unspecified foot: Secondary | ICD-10-CM | POA: Diagnosis present

## 2014-06-04 DIAGNOSIS — I5032 Chronic diastolic (congestive) heart failure: Secondary | ICD-10-CM | POA: Diagnosis present

## 2014-06-04 DIAGNOSIS — E782 Mixed hyperlipidemia: Secondary | ICD-10-CM | POA: Diagnosis present

## 2014-06-04 DIAGNOSIS — I2510 Atherosclerotic heart disease of native coronary artery without angina pectoris: Secondary | ICD-10-CM | POA: Diagnosis present

## 2014-06-04 DIAGNOSIS — Z96659 Presence of unspecified artificial knee joint: Secondary | ICD-10-CM

## 2014-06-04 DIAGNOSIS — Z7982 Long term (current) use of aspirin: Secondary | ICD-10-CM

## 2014-06-04 DIAGNOSIS — Z952 Presence of prosthetic heart valve: Secondary | ICD-10-CM

## 2014-06-04 DIAGNOSIS — N400 Enlarged prostate without lower urinary tract symptoms: Secondary | ICD-10-CM | POA: Diagnosis present

## 2014-06-04 DIAGNOSIS — I2789 Other specified pulmonary heart diseases: Secondary | ICD-10-CM | POA: Diagnosis not present

## 2014-06-04 DIAGNOSIS — I4891 Unspecified atrial fibrillation: Secondary | ICD-10-CM | POA: Diagnosis present

## 2014-06-04 DIAGNOSIS — I509 Heart failure, unspecified: Secondary | ICD-10-CM | POA: Diagnosis present

## 2014-06-04 DIAGNOSIS — K219 Gastro-esophageal reflux disease without esophagitis: Secondary | ICD-10-CM | POA: Diagnosis present

## 2014-06-04 HISTORY — PX: INTRAOPERATIVE TRANSESOPHAGEAL ECHOCARDIOGRAM: SHX5062

## 2014-06-04 HISTORY — DX: Presence of prosthetic heart valve: Z95.2

## 2014-06-04 HISTORY — PX: TRANSCATHETER AORTIC VALVE REPLACEMENT, TRANSFEMORAL: SHX6400

## 2014-06-04 LAB — POCT I-STAT, CHEM 8
BUN: 13 mg/dL (ref 6–23)
BUN: 12 mg/dL (ref 6–23)
BUN: 11 mg/dL (ref 6–23)
BUN: 13 mg/dL (ref 6–23)
CHLORIDE: 102 meq/L (ref 96–112)
Calcium, Ion: 1.29 mmol/L (ref 1.13–1.30)
Calcium, Ion: 1.22 mmol/L (ref 1.13–1.30)
Calcium, Ion: 1.12 mmol/L — ABNORMAL LOW (ref 1.13–1.30)
Calcium, Ion: 1.19 mmol/L (ref 1.13–1.30)
Chloride: 101 meq/L (ref 96–112)
Chloride: 106 meq/L (ref 96–112)
Chloride: 101 mEq/L (ref 96–112)
Creatinine, Ser: 0.7 mg/dL (ref 0.50–1.35)
Creatinine, Ser: 0.6 mg/dL (ref 0.50–1.35)
Creatinine, Ser: 0.6 mg/dL (ref 0.50–1.35)
Creatinine, Ser: 1.1 mg/dL (ref 0.50–1.35)
Glucose, Bld: 138 mg/dL — ABNORMAL HIGH (ref 70–99)
Glucose, Bld: 131 mg/dL — ABNORMAL HIGH (ref 70–99)
Glucose, Bld: 128 mg/dL — ABNORMAL HIGH (ref 70–99)
Glucose, Bld: 137 mg/dL — ABNORMAL HIGH (ref 70–99)
HCT: 33 % — ABNORMAL LOW (ref 39.0–52.0)
HCT: 30 % — ABNORMAL LOW (ref 39.0–52.0)
HCT: 28 % — ABNORMAL LOW (ref 39.0–52.0)
HEMATOCRIT: 33 % — AB (ref 39.0–52.0)
HEMOGLOBIN: 11.2 g/dL — AB (ref 13.0–17.0)
Hemoglobin: 11.2 g/dL — ABNORMAL LOW (ref 13.0–17.0)
Hemoglobin: 10.2 g/dL — ABNORMAL LOW (ref 13.0–17.0)
Hemoglobin: 9.5 g/dL — ABNORMAL LOW (ref 13.0–17.0)
POTASSIUM: 4.1 meq/L (ref 3.7–5.3)
Potassium: 4.1 meq/L (ref 3.7–5.3)
Potassium: 4 mEq/L (ref 3.7–5.3)
Potassium: 3.6 meq/L — ABNORMAL LOW (ref 3.7–5.3)
SODIUM: 137 meq/L (ref 137–147)
SODIUM: 137 meq/L (ref 137–147)
Sodium: 137 meq/L (ref 137–147)
Sodium: 141 meq/L (ref 137–147)
TCO2: 27 mmol/L (ref 0–100)
TCO2: 25 mmol/L (ref 0–100)
TCO2: 22 mmol/L (ref 0–100)
TCO2: 22 mmol/L (ref 0–100)

## 2014-06-04 LAB — GLUCOSE, CAPILLARY
GLUCOSE-CAPILLARY: 97 mg/dL (ref 70–99)
Glucose-Capillary: 130 mg/dL — ABNORMAL HIGH (ref 70–99)
Glucose-Capillary: 125 mg/dL — ABNORMAL HIGH (ref 70–99)
Glucose-Capillary: 74 mg/dL (ref 70–99)

## 2014-06-04 LAB — CBC
HCT: 31.5 % — ABNORMAL LOW (ref 39.0–52.0)
HCT: 33.5 % — ABNORMAL LOW (ref 39.0–52.0)
HEMOGLOBIN: 11.1 g/dL — AB (ref 13.0–17.0)
Hemoglobin: 10.5 g/dL — ABNORMAL LOW (ref 13.0–17.0)
MCH: 30 pg (ref 26.0–34.0)
MCH: 30.2 pg (ref 26.0–34.0)
MCHC: 33.3 g/dL (ref 30.0–36.0)
MCHC: 33.1 g/dL (ref 30.0–36.0)
MCV: 90 fL (ref 78.0–100.0)
MCV: 91 fL (ref 78.0–100.0)
PLATELETS: 191 10*3/uL (ref 150–400)
Platelets: 183 10*3/uL (ref 150–400)
RBC: 3.5 MIL/uL — ABNORMAL LOW (ref 4.22–5.81)
RBC: 3.68 MIL/uL — ABNORMAL LOW (ref 4.22–5.81)
RDW: 16.3 % — ABNORMAL HIGH (ref 11.5–15.5)
RDW: 16.3 % — ABNORMAL HIGH (ref 11.5–15.5)
WBC: 3.2 10*3/uL — ABNORMAL LOW (ref 4.0–10.5)
WBC: 6 10*3/uL (ref 4.0–10.5)

## 2014-06-04 LAB — MAGNESIUM: MAGNESIUM: 1.7 mg/dL (ref 1.5–2.5)

## 2014-06-04 LAB — POCT I-STAT 4, (NA,K, GLUC, HGB,HCT)
Glucose, Bld: 131 mg/dL — ABNORMAL HIGH (ref 70–99)
HCT: 31 % — ABNORMAL LOW (ref 39.0–52.0)
Hemoglobin: 10.5 g/dL — ABNORMAL LOW (ref 13.0–17.0)
Potassium: 4 mEq/L (ref 3.7–5.3)
SODIUM: 138 meq/L (ref 137–147)

## 2014-06-04 LAB — POCT I-STAT 3, ART BLOOD GAS (G3+)
Acid-base deficit: 1 mmol/L (ref 0.0–2.0)
BICARBONATE: 25.7 meq/L — AB (ref 20.0–24.0)
Bicarbonate: 23.2 meq/L (ref 20.0–24.0)
O2 Saturation: 100 %
O2 Saturation: 90 %
PH ART: 7.379 (ref 7.350–7.450)
PO2 ART: 56 mmHg — AB (ref 80.0–100.0)
TCO2: 24 mmol/L (ref 0–100)
TCO2: 27 mmol/L (ref 0–100)
pCO2 arterial: 37 mmHg (ref 35.0–45.0)
pCO2 arterial: 42.7 mmHg (ref 35.0–45.0)
pH, Arterial: 7.406 (ref 7.350–7.450)
pO2, Arterial: 204 mmHg — ABNORMAL HIGH (ref 80.0–100.0)

## 2014-06-04 LAB — APTT: aPTT: 33 seconds (ref 24–37)

## 2014-06-04 LAB — PROTIME-INR
INR: 1.1 (ref 0.00–1.49)
INR: 1.15 (ref 0.00–1.49)
Prothrombin Time: 14.2 seconds (ref 11.6–15.2)
Prothrombin Time: 14.7 seconds (ref 11.6–15.2)

## 2014-06-04 LAB — CREATININE, SERUM
CREATININE: 1.01 mg/dL (ref 0.50–1.35)
GFR calc Af Amer: 76 mL/min — ABNORMAL LOW (ref >90)
GFR calc non Af Amer: 65 mL/min — ABNORMAL LOW (ref >90)

## 2014-06-04 LAB — TYPE AND SCREEN
ABO/RH(D): A POS
Antibody Screen: NEGATIVE

## 2014-06-04 SURGERY — IMPLANTATION, AORTIC VALVE, TRANSCATHETER, FEMORAL APPROACH
Anesthesia: General | Site: Chest

## 2014-06-04 MED ORDER — INSULIN ASPART 100 UNIT/ML ~~LOC~~ SOLN
0.0000 [IU] | SUBCUTANEOUS | Status: DC
  Administered 2014-06-04 (×2): 2 [IU] via SUBCUTANEOUS

## 2014-06-04 MED ORDER — HEPARIN SODIUM (PORCINE) 5000 UNIT/ML IJ SOLN
INTRAMUSCULAR | Status: DC | PRN
  Administered 2014-06-04 (×3)

## 2014-06-04 MED ORDER — MIDAZOLAM HCL 5 MG/5ML IJ SOLN
INTRAMUSCULAR | Status: DC | PRN
  Administered 2014-06-04: 1 mg via INTRAVENOUS

## 2014-06-04 MED ORDER — PANTOPRAZOLE SODIUM 40 MG PO TBEC
40.0000 mg | DELAYED_RELEASE_TABLET | Freq: Every day | ORAL | Status: DC
  Administered 2014-06-05 – 2014-06-06 (×2): 40 mg via ORAL
  Filled 2014-06-04 (×2): qty 1

## 2014-06-04 MED ORDER — DEXMEDETOMIDINE HCL IN NACL 200 MCG/50ML IV SOLN: 0.1000 ug/kg/h | INTRAVENOUS | Status: DC

## 2014-06-04 MED ORDER — WARFARIN SODIUM 5 MG PO TABS
5.0000 mg | ORAL_TABLET | Freq: Every day | ORAL | Status: DC
  Administered 2014-06-05: 5 mg via ORAL
  Filled 2014-06-04 (×2): qty 1

## 2014-06-04 MED ORDER — NEOSTIGMINE METHYLSULFATE 10 MG/10ML IV SOLN
INTRAVENOUS | Status: DC | PRN
  Administered 2014-06-04: 3 mg via INTRAVENOUS

## 2014-06-04 MED ORDER — ONDANSETRON HCL 4 MG/2ML IJ SOLN: 4.0000 mg | Freq: Four times a day (QID) | INTRAMUSCULAR | Status: DC | PRN

## 2014-06-04 MED ORDER — HEPARIN SODIUM (PORCINE) 1000 UNIT/ML IJ SOLN
INTRAMUSCULAR | Status: AC
  Filled 2014-06-04: qty 1

## 2014-06-04 MED ORDER — SUCCINYLCHOLINE CHLORIDE 20 MG/ML IJ SOLN
INTRAMUSCULAR | Status: AC
  Filled 2014-06-04: qty 1

## 2014-06-04 MED ORDER — MAGNESIUM SULFATE 4000MG/100ML IJ SOLN: 4.0000 g | Freq: Once | INTRAMUSCULAR | Status: DC

## 2014-06-04 MED ORDER — ACETAMINOPHEN 160 MG/5ML PO SOLN
1000.0000 mg | Freq: Four times a day (QID) | ORAL | Status: DC
Start: 2014-06-05 — End: 2014-06-05
  Filled 2014-06-04: qty 40

## 2014-06-04 MED ORDER — NEOSTIGMINE METHYLSULFATE 10 MG/10ML IV SOLN
INTRAVENOUS | Status: AC
  Filled 2014-06-04: qty 1

## 2014-06-04 MED ORDER — SODIUM CHLORIDE 0.9 % IJ SOLN
2000.0000 ug | INTRAVENOUS | Status: DC | PRN
  Administered 2014-06-04: .5 ug/kg/min via INTRAVENOUS

## 2014-06-04 MED ORDER — FINASTERIDE 5 MG PO TABS
5.0000 mg | ORAL_TABLET | Freq: Every day | ORAL | Status: DC
  Administered 2014-06-06: 5 mg via ORAL
  Filled 2014-06-04: qty 1

## 2014-06-04 MED ORDER — ROCURONIUM BROMIDE 50 MG/5ML IV SOLN
INTRAVENOUS | Status: AC
  Filled 2014-06-04: qty 1

## 2014-06-04 MED ORDER — DROPERIDOL 2.5 MG/ML IJ SOLN
0.6250 mg | INTRAMUSCULAR | Status: DC | PRN
  Filled 2014-06-04: qty 0.25

## 2014-06-04 MED ORDER — ALBUMIN HUMAN 5 % IV SOLN
INTRAVENOUS | Status: DC | PRN
  Administered 2014-06-04: 09:00:00 via INTRAVENOUS

## 2014-06-04 MED ORDER — VANCOMYCIN HCL IN DEXTROSE 1-5 GM/200ML-% IV SOLN
1000.0000 mg | Freq: Once | INTRAVENOUS | Status: AC
  Administered 2014-06-04: 1000 mg via INTRAVENOUS
  Filled 2014-06-04: qty 200

## 2014-06-04 MED ORDER — PHENYLEPHRINE HCL 10 MG/ML IJ SOLN
0.0000 ug/min | INTRAVENOUS | Status: DC
  Filled 2014-06-04: qty 2

## 2014-06-04 MED ORDER — METOPROLOL TARTRATE 12.5 MG HALF TABLET
12.5000 mg | ORAL_TABLET | Freq: Two times a day (BID) | ORAL | Status: DC
  Administered 2014-06-05: 12.5 mg via ORAL
  Filled 2014-06-04 (×3): qty 1

## 2014-06-04 MED ORDER — ACETAMINOPHEN 650 MG RE SUPP: 650.0000 mg | Freq: Once | RECTAL | Status: AC

## 2014-06-04 MED ORDER — MIDAZOLAM HCL 2 MG/2ML IJ SOLN
INTRAMUSCULAR | Status: AC
  Filled 2014-06-04: qty 2

## 2014-06-04 MED ORDER — TRAMADOL HCL 50 MG PO TABS
50.0000 mg | ORAL_TABLET | ORAL | Status: DC | PRN
  Administered 2014-06-05: 50 mg via ORAL
  Administered 2014-06-05: 100 mg via ORAL
  Filled 2014-06-04: qty 2
  Filled 2014-06-04: qty 1

## 2014-06-04 MED ORDER — SODIUM CHLORIDE 0.9 % IJ SOLN
INTRAMUSCULAR | Status: AC
  Filled 2014-06-04: qty 10

## 2014-06-04 MED ORDER — PROPOFOL 10 MG/ML IV BOLUS
INTRAVENOUS | Status: AC
  Filled 2014-06-04: qty 20

## 2014-06-04 MED ORDER — FENTANYL CITRATE 0.05 MG/ML IJ SOLN
INTRAMUSCULAR | Status: DC | PRN
  Administered 2014-06-04: 50 ug via INTRAVENOUS

## 2014-06-04 MED ORDER — ACETAMINOPHEN 160 MG/5ML PO SOLN
650.0000 mg | Freq: Once | ORAL | Status: AC
  Administered 2014-06-04: 650 mg
  Filled 2014-06-04: qty 20.3

## 2014-06-04 MED ORDER — FENTANYL CITRATE 0.05 MG/ML IJ SOLN: 25.0000 ug | INTRAMUSCULAR | Status: DC | PRN

## 2014-06-04 MED ORDER — PROPOFOL 10 MG/ML IV BOLUS
INTRAVENOUS | Status: DC | PRN
  Administered 2014-06-04: 30 mg via INTRAVENOUS

## 2014-06-04 MED ORDER — MORPHINE SULFATE 2 MG/ML IJ SOLN: 2.0000 mg | INTRAMUSCULAR | Status: DC | PRN

## 2014-06-04 MED ORDER — GLYCOPYRROLATE 0.2 MG/ML IJ SOLN
INTRAMUSCULAR | Status: AC
  Filled 2014-06-04: qty 2

## 2014-06-04 MED ORDER — MIDAZOLAM HCL 2 MG/2ML IJ SOLN: 2.0000 mg | INTRAMUSCULAR | Status: DC | PRN

## 2014-06-04 MED ORDER — PHENYLEPHRINE 40 MCG/ML (10ML) SYRINGE FOR IV PUSH (FOR BLOOD PRESSURE SUPPORT)
PREFILLED_SYRINGE | INTRAVENOUS | Status: AC
  Filled 2014-06-04: qty 10

## 2014-06-04 MED ORDER — SODIUM CHLORIDE 0.9 % IJ SOLN: 3.0000 mL | INTRAMUSCULAR | Status: DC | PRN

## 2014-06-04 MED ORDER — METOPROLOL TARTRATE 1 MG/ML IV SOLN: 2.5000 mg | INTRAVENOUS | Status: DC | PRN

## 2014-06-04 MED ORDER — EPHEDRINE SULFATE 50 MG/ML IJ SOLN
INTRAMUSCULAR | Status: AC
  Filled 2014-06-04: qty 1

## 2014-06-04 MED ORDER — WARFARIN - PHYSICIAN DOSING INPATIENT: Freq: Every day | Status: DC

## 2014-06-04 MED ORDER — DIGOXIN 125 MCG PO TABS
0.1250 mg | ORAL_TABLET | Freq: Every day | ORAL | Status: DC
  Administered 2014-06-05 – 2014-06-06 (×2): 0.125 mg via ORAL
  Filled 2014-06-04 (×2): qty 1

## 2014-06-04 MED ORDER — PROTAMINE SULFATE 10 MG/ML IV SOLN
INTRAVENOUS | Status: DC | PRN
  Administered 2014-06-04: 50 mg via INTRAVENOUS

## 2014-06-04 MED ORDER — ONDANSETRON HCL 4 MG/2ML IJ SOLN
INTRAMUSCULAR | Status: DC | PRN
  Administered 2014-06-04: 4 mg via INTRAVENOUS

## 2014-06-04 MED ORDER — INSULIN REGULAR HUMAN 100 UNIT/ML IJ SOLN
INTRAMUSCULAR | Status: DC
  Filled 2014-06-04: qty 2.5

## 2014-06-04 MED ORDER — ALBUMIN HUMAN 5 % IV SOLN
250.0000 mL | INTRAVENOUS | Status: DC | PRN
  Filled 2014-06-04: qty 250

## 2014-06-04 MED ORDER — NOREPINEPHRINE BITARTRATE 1 MG/ML IV SOLN
4000.0000 ug | INTRAVENOUS | Status: DC | PRN
  Administered 2014-06-04: 2 ug/min via INTRAVENOUS

## 2014-06-04 MED ORDER — SODIUM CHLORIDE 0.9 % IV SOLN
1.0000 mL/kg/h | INTRAVENOUS | Status: AC
  Administered 2014-06-04: 1 mL/kg/h via INTRAVENOUS

## 2014-06-04 MED ORDER — ONDANSETRON HCL 4 MG/2ML IJ SOLN
INTRAMUSCULAR | Status: AC
  Filled 2014-06-04: qty 2

## 2014-06-04 MED ORDER — GUAIFENESIN-CODEINE 100-10 MG/5ML PO SOLN
5.0000 mL | Freq: Four times a day (QID) | ORAL | Status: DC | PRN
  Administered 2014-06-04 – 2014-06-05 (×2): 5 mL via ORAL
  Filled 2014-06-04 (×3): qty 5

## 2014-06-04 MED ORDER — FAMOTIDINE IN NACL 20-0.9 MG/50ML-% IV SOLN
20.0000 mg | Freq: Two times a day (BID) | INTRAVENOUS | Status: AC
Start: 2014-06-04 — End: 2014-06-04
  Administered 2014-06-04 (×2): 20 mg via INTRAVENOUS
  Filled 2014-06-04 (×2): qty 50

## 2014-06-04 MED ORDER — POTASSIUM CHLORIDE 10 MEQ/50ML IV SOLN: 10.0000 meq | INTRAVENOUS | Status: AC

## 2014-06-04 MED ORDER — DEXTROSE 5 % IV SOLN
1.5000 g | Freq: Two times a day (BID) | INTRAVENOUS | Status: DC
  Administered 2014-06-04 – 2014-06-05 (×3): 1.5 g via INTRAVENOUS
  Filled 2014-06-04 (×4): qty 1.5

## 2014-06-04 MED ORDER — NITROGLYCERIN IN D5W 200-5 MCG/ML-% IV SOLN: 0.0000 ug/min | INTRAVENOUS | Status: DC

## 2014-06-04 MED ORDER — ACETAMINOPHEN 500 MG PO TABS
1000.0000 mg | ORAL_TABLET | Freq: Four times a day (QID) | ORAL | Status: DC
Start: 2014-06-05 — End: 2014-06-05
  Administered 2014-06-05 (×3): 1000 mg via ORAL
  Filled 2014-06-04 (×6): qty 2

## 2014-06-04 MED ORDER — GLYCOPYRROLATE 0.2 MG/ML IJ SOLN
INTRAMUSCULAR | Status: DC | PRN
  Administered 2014-06-04: 0.4 mg via INTRAVENOUS

## 2014-06-04 MED ORDER — INSULIN REGULAR BOLUS VIA INFUSION
0.0000 [IU] | Freq: Three times a day (TID) | INTRAVENOUS | Status: DC
  Filled 2014-06-04: qty 10

## 2014-06-04 MED ORDER — SODIUM CHLORIDE 0.9 % IJ SOLN: 3.0000 mL | Freq: Two times a day (BID) | INTRAMUSCULAR | Status: DC

## 2014-06-04 MED ORDER — FENTANYL CITRATE 0.05 MG/ML IJ SOLN
INTRAMUSCULAR | Status: AC
  Filled 2014-06-04: qty 5

## 2014-06-04 MED ORDER — LIDOCAINE HCL (CARDIAC) 20 MG/ML IV SOLN
INTRAVENOUS | Status: DC | PRN
Start: 2014-06-04 — End: 2014-06-04
  Administered 2014-06-04: 50 mg via INTRAVENOUS
  Administered 2014-06-04: 20 mg via INTRAVENOUS

## 2014-06-04 MED ORDER — PROTAMINE SULFATE 10 MG/ML IV SOLN
INTRAVENOUS | Status: AC
  Filled 2014-06-04: qty 5

## 2014-06-04 MED ORDER — ASPIRIN 81 MG PO CHEW
81.0000 mg | CHEWABLE_TABLET | Freq: Every day | ORAL | Status: DC
  Administered 2014-06-05 – 2014-06-06 (×2): 81 mg via ORAL
  Filled 2014-06-04 (×2): qty 1

## 2014-06-04 MED ORDER — IODIXANOL 320 MG/ML IV SOLN
INTRAVENOUS | Status: DC | PRN
  Administered 2014-06-04: 60.7 mL via INTRAVENOUS

## 2014-06-04 MED ORDER — LACTATED RINGERS IV SOLN
INTRAVENOUS | Status: DC | PRN
  Administered 2014-06-04: 07:00:00 via INTRAVENOUS

## 2014-06-04 MED ORDER — 0.9 % SODIUM CHLORIDE (POUR BTL) OPTIME
TOPICAL | Status: DC | PRN
  Administered 2014-06-04: 5000 mL

## 2014-06-04 MED ORDER — HEPARIN SODIUM (PORCINE) 1000 UNIT/ML IJ SOLN
INTRAMUSCULAR | Status: DC | PRN
  Administered 2014-06-04: 8000 [IU] via INTRAVENOUS

## 2014-06-04 MED ORDER — CETYLPYRIDINIUM CHLORIDE 0.05 % MT LIQD
7.0000 mL | Freq: Two times a day (BID) | OROMUCOSAL | Status: DC
  Administered 2014-06-04 – 2014-06-05 (×3): 7 mL via OROMUCOSAL

## 2014-06-04 MED ORDER — ROCURONIUM BROMIDE 100 MG/10ML IV SOLN
INTRAVENOUS | Status: DC | PRN
  Administered 2014-06-04: 20 mg via INTRAVENOUS
  Administered 2014-06-04: 50 mg via INTRAVENOUS

## 2014-06-04 MED ORDER — SODIUM CHLORIDE 0.9 % IV SOLN: 250.0000 mL | INTRAVENOUS | Status: DC | PRN

## 2014-06-04 MED ORDER — EPINEPHRINE HCL 1 MG/ML IJ SOLN
INTRAMUSCULAR | Status: AC
  Filled 2014-06-04: qty 1

## 2014-06-04 MED ORDER — EPHEDRINE SULFATE 50 MG/ML IJ SOLN
INTRAMUSCULAR | Status: DC | PRN
  Administered 2014-06-04: 10 mg via INTRAVENOUS

## 2014-06-04 MED ORDER — LIDOCAINE HCL (CARDIAC) 20 MG/ML IV SOLN
INTRAVENOUS | Status: AC
  Filled 2014-06-04: qty 5

## 2014-06-04 MED ORDER — GUAIFENESIN-DM 100-10 MG/5ML PO SYRP
5.0000 mL | ORAL_SOLUTION | ORAL | Status: DC | PRN
  Administered 2014-06-04 – 2014-06-06 (×4): 5 mL via ORAL
  Filled 2014-06-04 (×4): qty 5

## 2014-06-04 MED ORDER — METOPROLOL TARTRATE 25 MG/10 ML ORAL SUSPENSION
12.5000 mg | Freq: Two times a day (BID) | ORAL | Status: DC
  Filled 2014-06-04 (×3): qty 5

## 2014-06-04 MED ORDER — LACTATED RINGERS IV SOLN
500.0000 mL | Freq: Once | INTRAVENOUS | Status: AC | PRN
Start: 2014-06-04 — End: 2014-06-04

## 2014-06-04 SURGICAL SUPPLY — 122 items
ADAPTER CARDIOPLEGIA (MISCELLANEOUS) IMPLANT
ADH SKN CLS APL DERMABOND .7 (GAUZE/BANDAGES/DRESSINGS) ×2
ANTEGRADE CPLG (MISCELLANEOUS) IMPLANT
ATTRACTOMAT 16X20 MAGNETIC DRP (DRAPES) IMPLANT
BAG BANDED W/RUBBER/TAPE 36X54 (MISCELLANEOUS) ×3 IMPLANT
BAG DECANTER FOR FLEXI CONT (MISCELLANEOUS) IMPLANT
BAG EQP BAND 135X91 W/RBR TAPE (MISCELLANEOUS) ×2
BAG SNAP BAND KOVER 36X36 (MISCELLANEOUS) ×3 IMPLANT
BLADE 10 SAFETY STRL DISP (BLADE) ×3 IMPLANT
BLADE STERNUM SYSTEM 6 (BLADE) ×3 IMPLANT
BLADE SURG ROTATE 9660 (MISCELLANEOUS) IMPLANT
CABLE PACING FASLOC BIEGE (MISCELLANEOUS) ×3 IMPLANT
CABLE PACING FASLOC BLUE (MISCELLANEOUS) ×3 IMPLANT
CANISTER SUCTION 2500CC (MISCELLANEOUS) IMPLANT
CANNULA FEM VENOUS REMOTE 22FR (CANNULA) IMPLANT
CANNULA FEMORAL ART 14 SM (MISCELLANEOUS) IMPLANT
CANNULA GUNDRY RCSP 15FR (MISCELLANEOUS) IMPLANT
CANNULA OPTISITE PERFUSION 16F (CANNULA) IMPLANT
CANNULA OPTISITE PERFUSION 18F (CANNULA) IMPLANT
CANNULA SOFTFLOW AORTIC 7M21FR (CANNULA) IMPLANT
CANNULA VENOUS LOW PROF 34X46 (CANNULA) IMPLANT
CATH DIAG EXPO 6F VENT PIG 145 (CATHETERS) ×2 IMPLANT
CATH HEART VENT LEFT (CATHETERS) IMPLANT
CATH S G BIP PACING (SET/KITS/TRAYS/PACK) ×3 IMPLANT
CATH SOFT-VU 4F 65 STRAIGHT (CATHETERS) IMPLANT
CATH SOFT-VU STRAIGHT 4F 65CM (CATHETERS) ×3
CLIP TI MEDIUM 24 (CLIP) ×3 IMPLANT
CLIP TI WIDE RED SMALL 24 (CLIP) ×3 IMPLANT
CONN ST 1/4X3/8  BEN (MISCELLANEOUS)
CONN ST 1/4X3/8 BEN (MISCELLANEOUS) IMPLANT
CONNECTOR 1/2X3/8X1/2 3 WAY (MISCELLANEOUS)
CONNECTOR 1/2X3/8X1/2 3WAY (MISCELLANEOUS) IMPLANT
COVER DOME SNAP 22 D (MISCELLANEOUS) ×3 IMPLANT
COVER MAYO STAND STRL (DRAPES) ×3 IMPLANT
COVER PROBE W GEL 5X96 (DRAPES) IMPLANT
COVER SURGICAL LIGHT HANDLE (MISCELLANEOUS) ×3 IMPLANT
COVER TABLE BACK 60X90 (DRAPES) ×3 IMPLANT
CRADLE DONUT ADULT HEAD (MISCELLANEOUS) ×3 IMPLANT
DERMABOND ADVANCED (GAUZE/BANDAGES/DRESSINGS) ×1
DERMABOND ADVANCED .7 DNX12 (GAUZE/BANDAGES/DRESSINGS) ×2 IMPLANT
DRAIN CHANNEL 28F RND 3/8 FF (WOUND CARE) IMPLANT
DRAIN CHANNEL 32F RND 10.7 FF (WOUND CARE) IMPLANT
DRAPE INCISE IOBAN 66X45 STRL (DRAPES) IMPLANT
DRAPE SLUSH/WARMER DISC (DRAPES) ×3 IMPLANT
DRAPE TABLE COVER HEAVY DUTY (DRAPES) ×3 IMPLANT
DRSG TEGADERM 4X4.75 (GAUZE/BANDAGES/DRESSINGS) ×3 IMPLANT
ELECT REM PT RETURN 9FT ADLT (ELECTROSURGICAL) ×6
ELECTRODE REM PT RTRN 9FT ADLT (ELECTROSURGICAL) ×4 IMPLANT
FELT TEFLON 6X6 (MISCELLANEOUS) ×3 IMPLANT
FEMORAL VENOUS CANN RAP (CANNULA) IMPLANT
GAUZE SPONGE 4X4 12PLY STRL (GAUZE/BANDAGES/DRESSINGS) ×3 IMPLANT
GLOVE ECLIPSE 7.5 STRL STRAW (GLOVE) ×4 IMPLANT
GLOVE ECLIPSE 8.0 STRL XLNG CF (GLOVE) ×6 IMPLANT
GLOVE EUDERMIC 7 POWDERFREE (GLOVE) ×3 IMPLANT
GLOVE ORTHO TXT STRL SZ7.5 (GLOVE) ×3 IMPLANT
GOWN STRL REUS W/ TWL LRG LVL3 (GOWN DISPOSABLE) ×6 IMPLANT
GOWN STRL REUS W/ TWL XL LVL3 (GOWN DISPOSABLE) ×12 IMPLANT
GOWN STRL REUS W/TWL LRG LVL3 (GOWN DISPOSABLE) ×9
GOWN STRL REUS W/TWL XL LVL3 (GOWN DISPOSABLE) ×18
GUIDEWIRE SAF TJ AMPL .035X180 (WIRE) ×3 IMPLANT
GUIDEWIRE SAFE TJ AMPLATZ EXST (WIRE) ×1 IMPLANT
GUIDEWIRE STRAIGHT .035 260CM (WIRE) ×1 IMPLANT
HEMOSTAT POWDER SURGIFOAM 1G (HEMOSTASIS) IMPLANT
INSERT FOGARTY 61MM (MISCELLANEOUS) IMPLANT
INSERT FOGARTY XLG (MISCELLANEOUS) IMPLANT
KIT BASIN OR (CUSTOM PROCEDURE TRAY) ×3 IMPLANT
KIT DILATOR VASC 18G NDL (KITS) IMPLANT
KIT ROOM TURNOVER OR (KITS) ×3 IMPLANT
KIT SUCTION CATH 14FR (SUCTIONS) ×6 IMPLANT
LEAD PACING MYOCARDI (MISCELLANEOUS) IMPLANT
NDL PERC 18GX7CM (NEEDLE) ×2 IMPLANT
NEEDLE PERC 18GX7CM (NEEDLE) ×3 IMPLANT
NS IRRIG 1000ML POUR BTL (IV SOLUTION) ×9 IMPLANT
PACK AORTA (CUSTOM PROCEDURE TRAY) ×3 IMPLANT
PAD ARMBOARD 7.5X6 YLW CONV (MISCELLANEOUS) ×6 IMPLANT
PAD ELECT DEFIB RADIOL ZOLL (MISCELLANEOUS) ×3 IMPLANT
PATCH TACHOSII LRG 9.5X4.8 (VASCULAR PRODUCTS) IMPLANT
SET CANNULATION TOURNIQUET (MISCELLANEOUS) IMPLANT
SHEATH PINNACLE 6F 10CM (SHEATH) ×1 IMPLANT
SPONGE GAUZE 4X4 12PLY STER LF (GAUZE/BANDAGES/DRESSINGS) ×1 IMPLANT
SPONGE LAP 4X18 X RAY DECT (DISPOSABLE) ×3 IMPLANT
STOPCOCK MORSE 400PSI 3WAY (MISCELLANEOUS) ×3 IMPLANT
SUT ETHIBOND 2 0 SH (SUTURE) ×3
SUT ETHIBOND 2 0 SH 36X2 (SUTURE) ×2 IMPLANT
SUT ETHIBOND X763 2 0 SH 1 (SUTURE) ×6 IMPLANT
SUT GORETEX CV 4 TH 22 36 (SUTURE) ×2 IMPLANT
SUT GORETEX CV4 TH-18 (SUTURE) ×3 IMPLANT
SUT MNCRL AB 3-0 PS2 18 (SUTURE) ×3 IMPLANT
SUT PDS AB 1 CTX 36 (SUTURE) IMPLANT
SUT PROLENE 2 0 MH 48 (SUTURE) IMPLANT
SUT PROLENE 3 0 SH1 36 (SUTURE) IMPLANT
SUT PROLENE 4 0 RB 1 (SUTURE)
SUT PROLENE 4-0 RB1 .5 CRCL 36 (SUTURE) IMPLANT
SUT PROLENE 5 0 C 1 36 (SUTURE) ×9 IMPLANT
SUT PROLENE 6 0 C 1 30 (SUTURE) ×9 IMPLANT
SUT SILK  1 MH (SUTURE) ×1
SUT SILK 1 MH (SUTURE) ×2 IMPLANT
SUT SILK 2 0 SH CR/8 (SUTURE) ×3 IMPLANT
SUT TEM PAC WIRE 2 0 SH (SUTURE) IMPLANT
SUT VIC AB 1 CTX 36 (SUTURE) ×3
SUT VIC AB 1 CTX36XBRD ANBCTR (SUTURE) IMPLANT
SUT VIC AB 2-0 CT1 27 (SUTURE) ×3
SUT VIC AB 2-0 CT1 TAPERPNT 27 (SUTURE) IMPLANT
SUT VIC AB 2-0 CTX 36 (SUTURE) IMPLANT
SUT VIC AB 3-0 SH 8-18 (SUTURE) ×6 IMPLANT
SUT VIC AB 3-0 X1 27 (SUTURE) ×1 IMPLANT
SYR 30ML LL (SYRINGE) ×6 IMPLANT
SYR 50ML LL SCALE MARK (SYRINGE) ×3 IMPLANT
SYSTEM SAHARA CHEST DRAIN ATS (WOUND CARE) ×3 IMPLANT
TAPE CLOTH SURG 4X10 WHT LF (GAUZE/BANDAGES/DRESSINGS) ×1 IMPLANT
TOWEL OR 17X24 6PK STRL BLUE (TOWEL DISPOSABLE) ×9 IMPLANT
TOWEL OR 17X26 10 PK STRL BLUE (TOWEL DISPOSABLE) ×6 IMPLANT
TRAY FOLEY IC TEMP SENS 14FR (CATHETERS) ×3 IMPLANT
TUBE SUCT INTRACARD DLP 20F (MISCELLANEOUS) IMPLANT
TUBING HIGH PRESSURE 120CM (CONNECTOR) ×3 IMPLANT
UNDERPAD 30X30 INCONTINENT (UNDERPADS AND DIAPERS) ×3 IMPLANT
VALVE HRT TRANSCATH NOVAFL 29M (Valve) ×1 IMPLANT
VENT LEFT HEART 12002 (CATHETERS)
WATER STERILE IRR 1000ML POUR (IV SOLUTION) ×6 IMPLANT
WIRE .035 3MM-J 145CM (WIRE) ×1 IMPLANT
WIRE AMPLATZ SS-J .035X180CM (WIRE) ×1 IMPLANT
WIRE LUNDERQUIST .035X180CM (WIRE) ×1 IMPLANT

## 2014-06-04 NOTE — Op Note (Signed)
[facility]  TAVR OPERATIVE NOTE   Date of Procedure:  06/04/2014  Preoperative Diagnosis: Severe Aortic Stenosis   Postoperative Diagnosis: Same   Procedure:    Transcatheter Aortic Valve Replacement - Transfemoral Approach  Edwards Sapien XT THV (size 29 mm, model # 9300TFX, serial # [id number])   Co-Surgeons:  Gilford Raid, MD and [name], MD  Assistants:   Valentina Gu. Roxy Manns, MD and Lauree Chandler, MD  Anesthesiologist:  Annye Asa, MD  Echocardiographer:  Ena Dawley, MD  Pre-operative Echo Findings:  severe aortic stenosis  normal left ventricular systolic function  Mild mitral regurgitation  Post-operative Echo Findings:  no paravalvular leak  normal left ventricular systolic function  Mild mitral regurgitation (unchanged)  BRIEF CLINICAL NOTE AND INDICATIONS FOR SURGERY  This is an 80 year-old gentleman with severe symptomatic aortic stenosis, NYHA functional class 3 symptoms.   During the course of the patient's preoperative work up they have been evaluated comprehensively by a multidisciplinary team of specialists coordinated through the [facility] in the [facility].  They have been demonstrated to suffer from symptomatic severe aortic stenosis as noted above. The patient has been counseled extensively as to the relative risks and benefits of all options for the treatment of severe aortic stenosis including long term medical therapy, conventional surgery for aortic valve replacement, and transcatheter aortic valve replacement.  The patient has been independently evaluated by two cardiac surgeons including Dr Roxy Manns and Dr. Cyndia Bent, and they are felt to be at high risk for conventional surgical aortic valve replacement because of comorbidities including advanced age and poor functional status with gait instability, limited ambulation with support of a cane, and risk of prolonged  recovery from open surgical AVR.   Based upon review of all of the patient's preoperative diagnostic tests they are felt to be candidate for transcatheter aortic valve replacement using the transfemoral approach as an alternative to high risk conventional surgery.    Following the decision to proceed with transcatheter aortic valve replacement, a discussion has been held regarding what types of management strategies would be attempted intraoperatively in the event of life-threatening complications, including whether or not the patient would be considered a candidate for the use of cardiopulmonary bypass and/or conversion to open sternotomy for attempted surgical intervention.  The patient has been advised of a variety of complications that might develop peculiar to this approach including but not limited to risks of death, stroke, paravalvular leak, aortic dissection or other major vascular complications, aortic annulus rupture, device embolization, cardiac rupture or perforation, acute myocardial infarction, arrhythmia, heart block or bradycardia requiring permanent pacemaker placement, congestive heart failure, respiratory failure, renal failure, pneumonia, infection, other late complications related to structural valve deterioration or migration, or other complications that might ultimately cause a temporary or permanent loss of functional independence or other long term morbidity.  The patient provides full informed consent for the procedure as described and all questions were answered preoperatively.    DETAILS OF THE OPERATIVE PROCEDURE  PREPARATION:    The patient is brought to the operating room on the above mentioned date and central monitoring was established by the anesthesia team including placement of Swan-Ganz catheter and radial arterial line. The patient is placed in the supine position on the operating table.  Intravenous antibiotics are administered. General endotracheal anesthesia is  induced uneventfully. A Foley catheter is placed.  Baseline transesophageal echocardiogram was performed. The patient's chest, abdomen, both groins, and both  lower extremities are prepared and draped in a sterile manner. A time out procedure is performed.   PERIPHERAL ACCESS:    Using the modified Seldinger technique, femoral arterial and venous access was obtained with placement of 6 Fr sheaths on the left side.  A pigtail diagnostic catheter was passed through the left femoral arterial sheath under fluoroscopic guidance into the aortic root.  A temporary transvenous pacemaker catheter was passed through the left femoral venous sheath under fluoroscopic guidance into the right ventricle.  The pacemaker was tested to ensure stable lead placement and pacemaker capture. Aortic root angiography was performed in order to determine the optimal angiographic angle for valve deployment.   TRANSFEMORAL ACCESS:   A right femoral arterial cutdown was performed by Dr Cyndia Bent. Please see his separate operative note for details. The patient was heparinized systemically and ACT verified > 250 seconds.    A 20 Fr transfemoral sheath was introduced into the right femoral artery after progressively dilating over an Amplatz superstiff wire. The Amplatz wire was changed out for a Lunderquist wire because of heavy calcification, tortuosity, and resistance with advancing dilators. The sheath tracked better over the stiffer wire. An AL-1 catheter was used to direct a straight-tip exchange length wire across the native aortic valve into the left ventricle. This was exchanged out for a pigtail catheter and position was confirmed in the LV apex. Simultaneous LV and Ao pressures were recorded.  The pigtail catheter was then exchanged for an Amplatz Extra-stiff wire in the LV apex. At that point, BAV was performed using a 25 mm valvuloplasty balloon.  Once optimal position was achieved, BAV was done under rapid ventricular pacing  at 180 bpm. The patient recovered well hemodynamically.   TRANSCATHETER HEART VALVE DEPLOYMENT:  An Edwards Sapien XT THV (size 29 mm) was prepared and crimped per manufacturer's guidelines, and the proper orientation of the valve is confirmed on the [employer] delivery system. The valve was advanced through the introducer sheath using normal technique until in an appropriate position in the abdominal aorta beyond the sheath tip. The balloon was then retracted and using the fine-tuning wheel was centered on the valve. The valve was then advanced across the aortic arch using appropriate flexion of the catheter. The valve was carefully positioned across the aortic valve annulus. The Novaflex catheter was retracted using normal technique. Once final position of the valve has been confirmed by angiographic assessment, the valve is deployed while temporarily holding ventilation and during rapid ventricular pacing to maintain systolic blood pressure < 50 mmHg and pulse pressure < 10 mmHg. The balloon inflation is held for >3 seconds after reaching full deployment volume. Once the balloon has fully deflated the balloon is retracted into the ascending aorta and valve function is assessed using TEE. There is felt to be no paravalvular leak and no central aortic insufficiency.  The patient's hemodynamic recovery following valve deployment is good.  The deployment balloon and guidewire are both removed. Echo demostrated acceptable post-procedural gradients, stable mitral valve function, and no AI.   PROCEDURE COMPLETION:  The sheath was then removed and arteriotomy repaired by Dr Cyndia Bent. Please see his separate report for details. Distal abdominal aortography was performed to evaluate for any arterial injury related to the procedure. There was no evidence dissection, perforation, or other vascular injury in the abdominal aorta, iliac artery, or femoral artery.  Protamine was administered once femoral arterial  repair was complete. The temporary pacemaker, pigtail catheters and femoral sheaths were removed  with manual pressure used for hemostasis.   The patient tolerated the procedure well and is transported to the surgical intensive care in stable condition. There were no immediate intraoperative complications. All sponge instrument and needle counts are verified correct at completion of the operation.   No blood products were administered during the operation.  The patient received a total of 61 mL of iodinated contrast during the procedure.  [name] 06/04/2014 10:28 AM
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
## 2014-06-04 NOTE — Progress Notes (Signed)
  Echocardiogram 2D Echocardiogram has been performed.  John [name] 06/04/2014, 10:12 AM

## 2014-06-04 NOTE — Progress Notes (Signed)
Utilization Review Completed.Donne Anon T8/18/2015

## 2014-06-04 NOTE — Progress Notes (Signed)
[facility] BRIEF SICU PROGRESS NOTE  Day of Surgery  S/P Procedure(s) (LRB): TRANSCATHETER AORTIC VALVE REPLACEMENT, TRANSFEMORAL (N/A) INTRAOPERATIVE TRANSESOPHAGEAL ECHOCARDIOGRAM (N/A)   Feels fine.  Denies pain.  Complains of chronic cough Afib w/ stable HR BP stable UOP adequate  Plan: Continue routine early postop  Nilo Fallin H 06/04/2014 7:16 PM

## 2014-06-04 NOTE — Transfer of Care (Signed)
Immediate Anesthesia Transfer of Care Note  Patient: John [name]  Procedure(s) Performed: Procedure(s): TRANSCATHETER AORTIC VALVE REPLACEMENT, TRANSFEMORAL (N/A) INTRAOPERATIVE TRANSESOPHAGEAL ECHOCARDIOGRAM (N/A)  Patient Location: SICU  Anesthesia Type:General  Level of Consciousness: awake, alert , oriented and patient cooperative  Airway & Oxygen Therapy: Patient Spontanous Breathing and Patient connected to nasal cannula oxygen  Post-op Assessment: Report given to PACU RN, Post -op Vital signs reviewed and stable and Patient moving all extremities  Post vital signs: Reviewed and stable  Complications: No apparent anesthesia complications

## 2014-06-04 NOTE — Op Note (Signed)
CARDIOTHORACIC SURGERY OPERATIVE NOTE  Date of Procedure:  06/04/2014  Preoperative Diagnosis: Severe Aortic Stenosis   Postoperative Diagnosis: Same   Procedure:    Transcatheter Aortic Valve Replacement - Right Transfemoral Approach  Edwards Sapien XT (size 29 mm, model # 9300TFX, serial # [id number])   Co-Surgeons:  Gaye Pollack, MD and [name], MD  Assistants:   Valentina Gu. Roxy Manns, MD and Lauree Chandler, MD  Anesthesiologist:  Annye Asa, MD  Echocardiographer:  Ena Dawley, MD  Pre-operative Echo Findings:  severe aortic stenosis  normal left ventricular systolic function  mild MR  Post-operative Echo Findings:  no paravalvular leak  normal left ventricular systolic function  mild MR (unchanged)     DETAILS OF THE OPERATIVE PROCEDURE  The majority of the procedure is documented separately in a procedure note by Dr. Burt Knack.   TRANSFEMORAL ACCESS:   A small incision is made in the right groin immediately over the common femoral artery. The subcutaneous tissues are divided with electrocautery and the anterior surface of the common femoral artery is identified. Sharp dissection is utilized to free up the artery proximally and distally and the vessel is encircled with a vessel loop.  A pair of CV-4 Gore-tex sutures are place as diamond-shaped purse-strings on the anterior surface of the femoral artery.  The patient is heparinized systemically and ACT verified > 250 seconds.  The common femoral artery is punctured using an  18 gauge needle and a soft J-tipped guidewire is passed into the common iliac artery under fluoroscopic guidance.  A 6 Fr straight diagnostic catheter is placed over the guidewire and the guidewire is removed.  An Amplatz super stiff guidewire is passed through the sheath into the descending thoracic aorta and the introducing diagnostic catheter is removed.  Serial dilators are passed over the guidewire under continuous  fluoroscopic guidance, making certain that each dilator passes easily all of the way into the distal abdominal aorta.  A 20 Fr Edwards Novoflex Plus introducer sheath is passed over the guidewire into the abdominal aorta.  The introducing dilator is removed, the sheath is flushed with heparinized saline, and the sheath is secured to the skin.    FEMORAL SHEATH REMOVAL AND ARTERIAL CLOSURE:  After the completion of successful valve deployment as documented separately by Dr. Burt Knack, the femoral artery sheath is removed and the arteriotomy is closed using the previously placed Gore-tex purse-string sutures. Once the repair has been completed protamine was administered to reverse the anticoagulation. A digitally-subtracted arteriogram is obtained from just above the aortic bifurcation to below the arteriotomy to confirm the integrity of the vascular repair.  The incision is irrigated with saline solution and subsequently closed in multiple layers using absorbable suture.  The skin incision is closed using a subcuticular skin closure.    Gaye Pollack,  MD 06/04/2014 11:42 AM

## 2014-06-04 NOTE — Anesthesia Postprocedure Evaluation (Signed)
  Anesthesia Post-op Note  Patient: John [name]  Procedure(s) Performed: Procedure(s): TRANSCATHETER AORTIC VALVE REPLACEMENT, TRANSFEMORAL (N/A) INTRAOPERATIVE TRANSESOPHAGEAL ECHOCARDIOGRAM (N/A)  Patient Location: SICU  Anesthesia Type:General  Level of Consciousness: awake, alert , oriented and patient cooperative  Airway and Oxygen Therapy: Patient Spontanous Breathing and Patient connected to nasal cannula oxygen  Post-op Pain: none  Post-op Assessment: Post-op Vital signs reviewed, Patient's Cardiovascular Status Stable, Respiratory Function Stable, Patent Airway and No signs of Nausea or vomiting  Post-op Vital Signs: Reviewed and stable  Last Vitals:  Filed Vitals:   06/04/14 0549  BP: 116/67  Pulse: 69  Temp: 36.5 C  Resp: 18    Complications: No apparent anesthesia complications

## 2014-06-04 NOTE — Interval H&P Note (Signed)
History and Physical Interval Note:  06/04/2014 7:28 AM  John [name]  has presented today for surgery, with the diagnosis of SEVERE AS  The various methods of treatment have been discussed with the patient and family. After consideration of risks, benefits and other options for treatment, the patient has consented to  Procedure(s): TRANSCATHETER AORTIC VALVE REPLACEMENT, TRANSFEMORAL (N/A) INTRAOPERATIVE TRANSESOPHAGEAL ECHOCARDIOGRAM (N/A) as a surgical intervention .  The patient's history has been reviewed, patient examined, no change in status, stable for surgery.  I have reviewed the patient's chart and labs.  Questions were answered to the patient's satisfaction.    We have reviewed John [name] studies extensively as a group. Plans for right transfemoral TAVR via a right femoral approach have been reviewed with the patient and family who agree to proceed. Please see extensive notes by the multidisciplinary team for details.   [name]

## 2014-06-05 ENCOUNTER — Encounter (HOSPITAL_COMMUNITY): Payer: Self-pay | Admitting: Cardiovascular Disease

## 2014-06-05 ENCOUNTER — Inpatient Hospital Stay (HOSPITAL_COMMUNITY): Payer: Medicare Other

## 2014-06-05 DIAGNOSIS — I369 Nonrheumatic tricuspid valve disorder, unspecified: Secondary | ICD-10-CM

## 2014-06-05 DIAGNOSIS — I359 Nonrheumatic aortic valve disorder, unspecified: Principal | ICD-10-CM

## 2014-06-05 LAB — BASIC METABOLIC PANEL
Anion gap: 10 (ref 5–15)
BUN: 13 mg/dL (ref 6–23)
CHLORIDE: 102 meq/L (ref 96–112)
CO2: 24 meq/L (ref 19–32)
Calcium: 8.6 mg/dL (ref 8.4–10.5)
Creatinine, Ser: 1.12 mg/dL (ref 0.50–1.35)
GFR calc Af Amer: 67 mL/min — ABNORMAL LOW (ref >90)
GFR calc non Af Amer: 57 mL/min — ABNORMAL LOW (ref >90)
Glucose, Bld: 106 mg/dL — ABNORMAL HIGH (ref 70–99)
POTASSIUM: 4.1 meq/L (ref 3.7–5.3)
Sodium: 136 mEq/L — ABNORMAL LOW (ref 137–147)

## 2014-06-05 LAB — CBC
HCT: 32.8 % — ABNORMAL LOW (ref 39.0–52.0)
HEMOGLOBIN: 11 g/dL — AB (ref 13.0–17.0)
MCH: 30.2 pg (ref 26.0–34.0)
MCHC: 33.5 g/dL (ref 30.0–36.0)
MCV: 90.1 fL (ref 78.0–100.0)
Platelets: 186 10*3/uL (ref 150–400)
RBC: 3.64 MIL/uL — AB (ref 4.22–5.81)
RDW: 16.4 % — ABNORMAL HIGH (ref 11.5–15.5)
WBC: 5 10*3/uL (ref 4.0–10.5)

## 2014-06-05 LAB — MAGNESIUM: MAGNESIUM: 1.7 mg/dL (ref 1.5–2.5)

## 2014-06-05 LAB — GLUCOSE, CAPILLARY
GLUCOSE-CAPILLARY: 113 mg/dL — AB (ref 70–99)
GLUCOSE-CAPILLARY: 90 mg/dL (ref 70–99)
Glucose-Capillary: 96 mg/dL (ref 70–99)

## 2014-06-05 LAB — PROTIME-INR
INR: 1.12 (ref 0.00–1.49)
PROTHROMBIN TIME: 14.4 s (ref 11.6–15.2)

## 2014-06-05 MED FILL — Sodium Chloride IV Soln 0.9%: INTRAVENOUS | Qty: 1000 | Status: AC

## 2014-06-05 NOTE — Progress Notes (Signed)
Report called to receiving RN, Katharine Look.  All questions answered.  Patient's medications, chart, and room belongings sent with patient.  Family present during transfer.  Patient ambulated to new room location on RA and tele monitor.  No complications, safety measures maintained.  Doran Clay, RN

## 2014-06-05 NOTE — Progress Notes (Signed)
    Subjective:  Feels and looks good. No CP, shortness of breath.  Objective:  Vital Signs in the last 24 hours: Temp:  [96.1 F (35.6 C)-99 F (37.2 C)] 97.1 F (36.2 C) (08/19 0830) Pulse Rate:  [30-83] 55 (08/19 1000) Resp:  [17-25] 20 (08/19 1000) BP: (83-129)/(49-72) 92/55 mmHg (08/19 0900) SpO2:  [93 %-100 %] 95 % (08/19 1000) Arterial Line BP: (85-151)/(35-67) 129/48 mmHg (08/19 0900) Weight:  [162 lb 12.8 oz (73.846 kg)] 162 lb 12.8 oz (73.846 kg) (08/19 0500)  Intake/Output from previous day: 08/18 0701 - 08/19 0700 In: 2466.4 [I.V.:1866.4; IV Piggyback:600] Out: 2515 [Urine:2415; Blood:100]  Physical Exam: Pt is alert and oriented, NAD HEENT: normal Neck: JVP - normal Lungs: CTA bilaterally CV: irregular without murmur or gallop Abd: soft, NT, Positive BS, no hepatomegaly Ext: no C/C/E, distal pulses intact and equal, bilateral groin sites clear Skin: Ripp/dry no rash   Lab Results:  Recent Labs  06/04/14 1800 06/04/14 1810 06/05/14 0355  WBC 6.0  --  5.0  HGB 11.1* 11.2* 11.0*  PLT 183  --  186    Recent Labs  06/04/14 1810 06/05/14 0355  NA 137 136*  K 4.1 4.1  CL 101 102  CO2  --  24  GLUCOSE 137* 106*  BUN 13 13  CREATININE 1.10 1.12   No results found for this basename: TROPONINI, CK, MB,  in the last 72 hours  Cardiac Studies: POD #1 echo pending  Tele: Atrial fib, ventricular rate controlled  Assessment/Plan:  POD #1 from TAVR - doing well, tx stepdown. Orders per Dr Cyndia Bent.  AFib - rate-controlled. Warfarin to restart today.  Await echo result, ambulate with cardiac rehab, anticipate d/c next 24-48 hours.  [name], M.D. 06/05/2014, 11:40 AM

## 2014-06-05 NOTE — Progress Notes (Signed)
Checked on patient post TAVR.  Currently in a fib and looks good.  Appreciate help of all of the team.  W. Doristine Church MD [facility]

## 2014-06-05 NOTE — Progress Notes (Signed)
1 Day Post-Op Procedure(s) (LRB): TRANSCATHETER AORTIC VALVE REPLACEMENT, TRANSFEMORAL (N/A) INTRAOPERATIVE TRANSESOPHAGEAL ECHOCARDIOGRAM (N/A) Subjective: No complaints  Objective: Vital signs in last 24 hours: Temp:  [95.7 F (35.4 C)-99 F (37.2 C)] 97.1 F (36.2 C) (08/19 0830) Pulse Rate:  [30-83] 68 (08/19 0700) Cardiac Rhythm:  [-] Atrial fibrillation (08/19 0400) Resp:  [16-25] 25 (08/19 0700) BP: (83-129)/(49-72) 83/64 mmHg (08/19 0700) SpO2:  [93 %-100 %] 95 % (08/19 0700) Arterial Line BP: (85-151)/(35-67) 97/50 mmHg (08/19 0700) Weight:  [73.846 kg (162 lb 12.8 oz)] 73.846 kg (162 lb 12.8 oz) (08/19 0500)  Hemodynamic parameters for last 24 hours: PAP: (28-44)/(7-23) 28/7 mmHg CO:  [3.5 L/min-4.7 L/min] 3.5 L/min CI:  [1.9 L/min/m2-2.6 L/min/m2] 1.9 L/min/m2  Intake/Output from previous day: 08/18 0701 - 08/19 0700 In: 2466.4 [I.V.:1866.4; IV Piggyback:600] Out: 2515 [Urine:2415; Blood:100] Intake/Output this shift:    General appearance: alert and cooperative Neurologic: intact Heart: irregularly irregular rhythm Lungs: clear to auscultation bilaterally Extremities: extremities normal, atraumatic, no cyanosis or edema and left foot wrapped in gauze Wound: right groin incision looks good  Lab Results:  Recent Labs  06/04/14 1800 06/04/14 1810 06/05/14 0355  WBC 6.0  --  5.0  HGB 11.1* 11.2* 11.0*  HCT 33.5* 33.0* 32.8*  PLT 183  --  186   BMET:  Recent Labs  06/04/14 1810 06/05/14 0355  NA 137 136*  [name] 4.1 4.1  CL 101 102  CO2  --  24  GLUCOSE 137* 106*  BUN 13 13  CREATININE 1.10 1.12  CALCIUM  --  8.6    PT/INR:  Recent Labs  06/05/14 0355  LABPROT 14.4  INR 1.12   ABG    Component Value Date/Time   PHART 7.379 06/04/2014 1054   HCO3 25.7* 06/04/2014 1054   TCO2 22 06/04/2014 1810   ACIDBASEDEF 1.0 06/04/2014 1016   O2SAT 90.0 06/04/2014 1054   CBG (last 3)   Recent Labs  06/04/14 2324 06/05/14 0340 06/05/14 0828  GLUCAP  74 113* 96    Assessment/Plan: S/P Procedure(s) (LRB): TRANSCATHETER AORTIC VALVE REPLACEMENT, TRANSFEMORAL (N/A) INTRAOPERATIVE TRANSESOPHAGEAL ECHOCARDIOGRAM (N/A) Doing well postop Mobilize d/c tubes/lines Plan for transfer to step-down: see transfer orders 2D echo today Coumadin and ASA for Afib, TAVR   LOS: 1 day    John [name] 06/05/2014

## 2014-06-05 NOTE — Progress Notes (Signed)
  Echocardiogram 2D Echocardiogram has been performed.  John [name] M 06/05/2014, 10:45 AM

## 2014-06-06 ENCOUNTER — Other Ambulatory Visit: Payer: Self-pay | Admitting: Cardiology

## 2014-06-06 DIAGNOSIS — Z952 Presence of prosthetic heart valve: Secondary | ICD-10-CM

## 2014-06-06 DIAGNOSIS — I350 Nonrheumatic aortic (valve) stenosis: Secondary | ICD-10-CM

## 2014-06-06 LAB — PROTIME-INR
INR: 1.17 (ref 0.00–1.49)
Prothrombin Time: 14.9 seconds (ref 11.6–15.2)

## 2014-06-06 MED ORDER — ASPIRIN 81 MG PO CHEW: 81.0000 mg | CHEWABLE_TABLET | Freq: Every day | ORAL | Status: DC

## 2014-06-06 MED ORDER — GUAIFENESIN-CODEINE 100-10 MG/5ML PO SYRP: 5.0000 mL | ORAL_SOLUTION | Freq: Three times a day (TID) | ORAL | Status: DC | PRN

## 2014-06-06 MED ORDER — METOPROLOL TARTRATE 25 MG PO TABS: 12.5000 mg | ORAL_TABLET | Freq: Two times a day (BID) | ORAL | Status: DC

## 2014-06-06 MED ORDER — METOPROLOL TARTRATE 12.5 MG HALF TABLET
12.5000 mg | ORAL_TABLET | Freq: Two times a day (BID) | ORAL | Status: DC
Start: 2014-06-06 — End: 2014-06-06
  Filled 2014-06-06 (×2): qty 1

## 2014-06-06 MED ORDER — TRAMADOL HCL 50 MG PO TABS: 50.0000 mg | ORAL_TABLET | ORAL | Status: DC | PRN

## 2014-06-06 MED ORDER — PANTOPRAZOLE SODIUM 40 MG PO TBEC: 40.0000 mg | DELAYED_RELEASE_TABLET | Freq: Every day | ORAL | Status: DC

## 2014-06-06 NOTE — Progress Notes (Signed)
      [address]       [place] [address]             [phone]      2 Days Post-Op Procedure(s) (LRB): TRANSCATHETER AORTIC VALVE REPLACEMENT, TRANSFEMORAL (N/A) INTRAOPERATIVE TRANSESOPHAGEAL ECHOCARDIOGRAM (N/A)  Subjective:  John [name] has no complaints this morning.  He is up getting himself cleaned up for the day.  He states he is ready to go home.  Objective: Vital signs in last 24 hours: Temp:  [97.1 F (36.2 C)-98.5 F (36.9 C)] 98.5 F (36.9 C) (08/20 0412) Pulse Rate:  [54-83] 75 (08/20 0412) Cardiac Rhythm:  [-] Atrial fibrillation (08/20 0412) Resp:  [15-35] 20 (08/20 0412) BP: (92-127)/(55-68) 102/60 mmHg (08/20 0412) SpO2:  [89 %-98 %] 94 % (08/20 0412) Arterial Line BP: (129)/(48) 129/48 mmHg (08/19 0900)  Intake/Output from previous day: 08/19 0701 - 08/20 0700 In: 270 [P.O.:240; I.V.:30] Out: 160 [Urine:160]  General appearance: alert, cooperative and no distress Heart: irregularly irregular rhythm Lungs: clear to auscultation bilaterally Abdomen: soft, non-tender; bowel sounds normal; no masses,  no organomegaly Extremities: extremities normal, atraumatic, no cyanosis or edema Wound: clean and dry  Lab Results:  Recent Labs  06/04/14 1800 06/04/14 1810 06/05/14 0355  WBC 6.0  --  5.0  HGB 11.1* 11.2* 11.0*  HCT 33.5* 33.0* 32.8*  PLT 183  --  186   BMET:  Recent Labs  06/04/14 1810 06/05/14 0355  NA 137 136*  K 4.1 4.1  CL 101 102  CO2  --  24  GLUCOSE 137* 106*  BUN 13 13  CREATININE 1.10 1.12  CALCIUM  --  8.6    PT/INR:  Recent Labs  06/06/14 0400  LABPROT 14.9  INR 1.17   ABG    Component Value Date/Time   PHART 7.379 06/04/2014 1054   HCO3 25.7* 06/04/2014 1054   TCO2 22 06/04/2014 1810   ACIDBASEDEF 1.0 06/04/2014 1016   O2SAT 90.0 06/04/2014 1054   CBG (last 3)   Recent Labs  06/05/14 0340 06/05/14 0828 06/05/14 1149  GLUCAP 113* 96 90    Assessment/Plan: S/P Procedure(s)  (LRB): TRANSCATHETER AORTIC VALVE REPLACEMENT, TRANSFEMORAL (N/A) INTRAOPERATIVE TRANSESOPHAGEAL ECHOCARDIOGRAM (N/A)  1. CV- chronic A.Fib- continue Digoxin, Proscar 2. INR 1.17- on Coumadin at 5 mg daily, which is home dose 3. Echo- shows moderate pulmonary HTN, no AI, or perivalvular leak 4. Dispo- patient doing well, care per Cardiology  LOS: 2 days    Ellwood Handler 06/06/2014

## 2014-06-06 NOTE — Progress Notes (Signed)
    Subjective:  Feels well. Setting up reading newspaper. No CP or dyspnea.   Objective:  Vital Signs in the last 24 hours: Temp:  [97.5 F (36.4 C)-98.5 F (36.9 C)] 98.5 F (36.9 C) (08/20 0412) Pulse Rate:  [54-75] 75 (08/20 0412) Resp:  [15-35] 20 (08/20 0412) BP: (102-127)/(57-68) 102/60 mmHg (08/20 0412) SpO2:  [89 %-95 %] 94 % (08/20 0412)  Intake/Output from previous day: 08/19 0701 - 08/20 0700 In: 270 [P.O.:240; I.V.:30] Out: 160 [Urine:160]  Physical Exam: Pt is alert and oriented, NAD HEENT: normal Neck: JVP - normal Lungs: CTA bilaterally CV: irregular without murmur or gallop Abd: soft, NT, Positive BS, no hepatomegaly Ext: no C/C/E Skin: [place]/dry no rash   Lab Results:  Recent Labs  06/04/14 1800 06/04/14 1810 06/05/14 0355  WBC 6.0  --  5.0  HGB 11.1* 11.2* 11.0*  PLT 183  --  186    Recent Labs  06/04/14 1810 06/05/14 0355  NA 137 136*  K 4.1 4.1  CL 101 102  CO2  --  24  GLUCOSE 137* 106*  BUN 13 13  CREATININE 1.10 1.12   No results found for this basename: TROPONINI, CK, MB,  in the last 72 hours  Cardiac Studies: 2D Echo  Study Conclusions  - Left ventricle: E/e&'>17.4 suggestive of elevated LV filling pressures. The cavity size was normal. There was moderate concentric hypertrophy. Systolic function was normal. The estimated ejection fraction was in the range of 60% to 65%. Wall motion was normal; there were no regional wall motion abnormalities. There was a reduced contribution of atrial contraction to ventricular filling, due to increased ventricular diastolic pressure or atrial contractile dysfunction. Doppler parameters are consistent with a reversible restrictive pattern, indicative of decreased left ventricular diastolic compliance and/or increased left atrial pressure (grade 3 diastolic dysfunction). - Aortic valve: S/P TAVR with stable AVR. Mean AVG is normal at 61mmHg and there is no evidence of AI. Valve area  (VTI): 2.01 cm^2. Valve area (Vmax): 1.71 cm^2. - Mitral valve: Heavily calcification of the posterior mitral valve annulus. There was mild regurgitation. - Left atrium: The atrium was severely dilated. - Tricuspid valve: There was moderate regurgitation. - Pulmonary arteries: PA peak pressure: 46 mm Hg (S).  Impressions:  - The right ventricular systolic pressure was increased consistent with moderate pulmonary hypertension.  Tele: Atrial fibrillation, heart rate 80-115 bpm, personally reviewed  Assessment/Plan:  1. Severe aortic stenosis now postop day #2 from TAVR 2. Permanent atrial fibrillation, chronically anticoagulated with warfarin  The patient has had an excellent early recovery from TAVR. He appears suitable for discharge this morning. Will arrange followup in 1-2 weeks with Dr. Wynonia Lawman. I will see him back with a same day echocardiogram at valve clinic in approximately 30 days. He was advised to followup in the coumadin clinic next week (followed by his PCP, Dr Harrington Challenger). Add metoprolol 12.5 mg BID for heart rate control in setting chronic AF.   [name], M.D. 06/06/2014, 9:17 AM

## 2014-06-06 NOTE — Discharge Instructions (Signed)
Have your coumadin level checked on Monday.  Heart Healthy Diet.  No strenuous activity - lifting over 5 pounds, no driving for 1 week.  Call [phone] for any questions or problems.   Warfarin: What You Need to Know Warfarin is an anticoagulant. Anticoagulants help prevent the formation of blood clots. They also help stop the growth of blood clots. Warfarin is sometimes referred to as a "blood thinner."  Normally, when body tissues are cut or damaged, the blood clots in order to prevent blood loss. Sometimes clots form inside your blood vessels and obstruct the flow of blood through your circulatory system (thrombosis). These clots may travel through your bloodstream and become lodged in smaller blood vessels in your brain, which can cause a stroke, or in your lungs (pulmonary embolism). WHO SHOULD USE WARFARIN? Warfarin is prescribed for people at risk of developing harmful blood clots:  People with surgically implanted mechanical heart valves, irregular heart rhythms called atrial fibrillation, and certain clotting disorders.  People who have developed harmful blood clotting in the past, including those who have had a stroke or a pulmonary embolism, or thrombosis in their legs (deep vein thrombosis [DVT]).  People with an existing blood clot, such as a pulmonary embolism. WARFARIN DOSING Warfarin tablets come in different strengths. Each tablet strength is a different color, with the amount of warfarin (in milligrams) clearly printed on the tablet. If the color of your tablet is different than usual when you receive a new prescription, report it immediately to your pharmacist or health care provider. WARFARIN MONITORING The goal of warfarin therapy is to lessen the clotting tendency of blood but not prevent clotting completely. Your health care provider will monitor the anticoagulation effect of warfarin closely and adjust your dose as needed. For your safety, blood tests called prothrombin time  (PT) or international normalized ratio (INR) are used to measure the effects of warfarin. Both of these tests can be done with a finger stick or a blood draw. The longer it takes the blood to clot, the higher the PT or INR. Your health care provider will inform you of your "target" PT or INR range. If, at any time, your PT or INR is above the target range, there is a risk of bleeding. If your PT or INR is below the target range, there is a risk of clotting. Whether you are started on warfarin while you are in the hospital or in your health care provider's office, you will need to have your PT or INR checked within one week of starting the medicine. Initially, some people are asked to have their PT or INR checked as much as twice a week. Once you are on a stable maintenance dose, the PT or INR is checked less often, usually once every 2 to 4 weeks. The warfarin dose may be adjusted if the PT or INR is not within the target range. It is important to keep all laboratory and health care provider follow-up appointments. Not keeping appointments could result in a chronic or permanent injury, pain, or disability because warfarin is a medicine that requires close monitoring. WHAT ARE THE SIDE EFFECTS OF WARFARIN?  Too much warfarin can cause bleeding (hemorrhage) from any part of the body. This may include bleeding from the gums, blood in the urine, bloody or dark stools, a nosebleed that is not easily stopped, coughing up blood, or vomiting blood.  Too little warfarin can increase the risk of blood clots.  Too little or too much  warfarin can also increase the risk of a stroke.  Warfarin use may cause a skin rash or irritation, an unusual fever, continual nausea or stomach upset, or severe pain in your joints or back. SPECIAL PRECAUTIONS WHILE TAKING WARFARIN Warfarin should be taken exactly as directed. It is very important to take warfarin as directed since bleeding or blood clots could result in chronic or  permanent injury, pain, or disability.  Take your medicine at the same time every day. If you forget to take your dose, you can take it if it is within 6 hours of when it was due.  Do not change the dose of warfarin on your own to make up for missed or extra doses.  If you miss more than 2 doses in a row, you should contact your health care provider for advice. Avoid situations that cause bleeding. You may have a tendency to bleed more easily than usual while taking warfarin. The following actions can limit bleeding:  Using a softer toothbrush.  Flossing with waxed floss rather than unwaxed floss.  Shaving with an Copy rather than a blade.  Limiting the use of sharp objects.  Avoiding potentially harmful activities, such as contact sports. Warfarin and Pregnancy or Breastfeeding  Warfarin is not advised during the first trimester of pregnancy due to an increased risk of birth defects. In certain situations, a woman may take warfarin after her first trimester of pregnancy. A woman who becomes pregnant or plans to become pregnant while taking warfarin should notify her health care provider immediately.  Although warfarin does not pass into breast milk, a woman who wishes to breastfeed while taking warfarin should also consult with her health care provider. Alcohol, Smoking, and Illicit Drug Use  Alcohol affects how warfarin works in the body. It is best to avoid alcoholic drinks or consume very small amounts while taking warfarin. In general, alcohol intake should be limited to 1 oz (30 mL) of liquor, 6 oz (180 mL) of wine, or 12 oz (360 mL) of beer each day. Notify your health care provider if you change your alcohol intake.  Smoking affects how warfarin works. It is best to avoid smoking while taking warfarin. Notify your health care provider if you change your smoking habits.  It is best to avoid all illicit drugs while taking warfarin since there are few studies that show how  warfarin interacts with these drugs. Other Medicines and Dietary Supplements Many prescription and over-the-counter medicines can interfere with warfarin. Be sure all of your health care providers know you are taking warfarin. Notify your health care provider who prescribed warfarin for you or your pharmacist before starting or stopping any new medicines, including over-the-counter vitamins, dietary supplements, and pain medicines. Your warfarin dose may need to be adjusted. Some common over-the-counter medicines that may increase the risk of bleeding while taking warfarin include:   Acetaminophen.  Aspirin.  Nonsteroidal anti-inflammatory medicines (NSAIDs), such as ibuprofen or naproxen.  Vitamin E. Dietary Considerations  Foods that have moderate or high amounts of vitamin K can interfere with warfarin. Avoid major changes in your diet or notify your health care provider before changing your diet. Eat a consistent amount of foods that have moderate or high amounts of vitamin K. Eating less foods containing vitamin K can increase the risk of bleeding. Eating more foods containing vitamin K can increase the risk of blood clots. Additional questions about dietary considerations can be discussed with a dietitian. Foods that are very high in  vitamin K:  Greens, such as Swiss chard and beet, collard, mustard, or turnip greens (fresh or frozen, cooked).  Kale (fresh or frozen, cooked).  Parsley (raw).  Spinach (cooked). Foods that are high in vitamin K:  Asparagus (frozen, cooked).  Beans, green (frozen, cooked).  Broccoli.  Bok choy (cooked).  Brussels sprouts (fresh or frozen, cooked).  Cabbage (cooked).   Coleslaw. Foods that are moderately high in vitamin K:  Blueberries.  Black-eyed peas.  Endive (raw).  Green leaf lettuce (raw).  Green scallions (raw).  Kale (raw).  Okra (frozen, cooked).  Plantains (fried).  Romaine lettuce (raw).  Sauerkraut  (canned).  Spinach (raw). CALL YOUR CLINIC OR HEALTH CARE PROVIDER IF YOU:  Plan to have any surgery or procedure.  Feel sick, especially if you have diarrhea or vomiting.  Experience or anticipate any major changes in your diet.  Start or stop a prescription or over-the-counter medicine.  Become, plan to become, or think you may be pregnant.  Are having heavier than usual menstrual periods.  Have had a fall, accident, or any symptoms of bleeding or unusual bruising.  Develop an unusual fever. CALL 911 IN THE [place] OR GO TO THE EMERGENCY DEPARTMENT IF YOU:   Think you may be having an allergic reaction to warfarin. The signs of an allergic reaction could include itching, rash, hives, swelling, chest tightness, or trouble breathing.  See signs of blood in your urine. The signs could include reddish, pinkish, or tea-colored urine.  See signs of blood in your stools. The signs could include bright red or black stools.  Vomit or cough up blood. In these instances, the blood could have either a bright red or a "coffee-grounds" appearance.  Have bleeding that will not stop after applying pressure for 30 minutes such as cuts, nosebleeds, or other injuries.  Have severe pain in your joints or back.  Have a new and severe headache.  Have sudden weakness or numbness of your face, arm, or leg, especially on one side of your body.  Have sudden confusion or trouble understanding.  Have sudden trouble seeing in one or both eyes.  Have sudden trouble walking, dizziness, loss of balance, or coordination.  Have trouble speaking or understanding (aphasia). Document Released: 10/04/2005 Document Revised: 02/18/2014 Document Reviewed: 03/30/2013 [facility] Patient Information 2015 [place], [place]. This information is not intended to replace advice given to you by your health care provider. Make sure you discuss any questions you have with your health care provider.

## 2014-06-06 NOTE — Discharge Summary (Signed)
Physician Discharge Summary       Patient ID: John [name] MRN: [id number] DOB/AGE: 1926-07-24 80 y.o.  Admit date: 06/04/2014 Discharge date: 06/06/2014  Discharge Diagnoses:  Principal Problem:   S/P TAVR (transcatheter aortic valve replacement) Active Problems:   Severe aortic stenosis   Atrial fibrillation, permanent   Long-term (current) use of anticoagulants   Aortic valve disorders   Discharged Condition: good  Procedures: Transcatheter Aortic Valve Replacement - Right Transfemoral Approach Edwards Sapien XT (size 29 mm, model # 9300TFX, serial # [id number] 06/04/14 by Dr. Burt Knack and Dr. [facility] Course: 80 year old gentleman referred to Dr. Burt Knack by Dr. Wynonia Lawman for evaluation of severe symptomatic aortic stenosis and consideration of TAVR. The patient also has paroxysmal atrial fibrillation and he is chronically anticoagulated with warfarin.  The patient has enjoyed relatively good health over the years. He continues to work in [employer]. He is accompanied today by his daughter who lives in [place]. He's been married for 30 years and lives locally with his wife.  Over the past 6-12 months, the patient has developed progressive shortness of breath with activity. Symptoms occurred with less than one block of walking at a normal pace. The patient ambulates with a walking stick. He does have some gait instability, probably related to a foot drop after lumbar back surgery. He has not had any falls. The patient denies chest pain or pressure, lightheadedness, or syncope. After traveling to the [facility], the patient developed leg swelling and has been prescribed Lasix. His most recent echocardiogram confirms severe and progressive aortic stenosis with a mean and peak transaortic valve gradient of 54 and 86 mmHg, respectively.  The patient's previous cardiac catheterization in 2007 demonstrated diffuse nonobstructive coronary artery disease. At that time his  aortic valve was noted to be calcified but only mildly stenotic based on hemodynamic data.  Cardiac cath 04/22/14 with Moderate RCA stenosis, severe stenosis of a small 1st diag branch, non obstructive LAD and LCX stenosis, severe AS with mild to mod AI.  Pt consulted with Dr. Cyndia Bent and Dr. Roxy Manns.  He had gated cardiac CT, CT of the abd, and pelvis and PFTs. These were all reviewed by the MDs.   "During the course of the patient's preoperative work up they have been evaluated comprehensively by a multidisciplinary team of specialists coordinated through the [facility] in the [facility]. They have been demonstrated to suffer from symptomatic severe aortic stenosis as noted above. The patient has been counseled extensively as to the relative risks and benefits of all options for the treatment of severe aortic stenosis including long term medical therapy, conventional surgery for aortic valve replacement, and transcatheter aortic valve replacement. Following the decision to proceed with transcatheter aortic valve replacement, a discussion has been held regarding what types of management strategies would be attempted intraoperatively in the event of life-threatening complications, including whether or not the patient would be considered a candidate for the use of cardiopulmonary bypass and/or conversion to open sternotomy for attempted surgical intervention. The patient has been advised of a variety of complications that might develop including but not limited to risks of death, stroke, paravalvular leak, aortic dissection or other major vascular complications, aortic annulus rupture, device embolization, cardiac rupture or perforation, mitral regurgitation, acute myocardial infarction, arrhythmia, heart block or bradycardia requiring permanent pacemaker placement, congestive heart failure, respiratory failure, renal failure, pneumonia, infection, other late  complications related to structural valve deterioration or  migration, or other complications that might ultimately cause a temporary or permanent loss of functional independence or other long term morbidity. The patient provides full informed consent for the procedure as described and all questions were answered." per Dr. Cyndia Bent.  Pt underwent TAVR on 0/86/57 without complications.  He progressed without complications. He maintained his permanent a fib and did well.  By 06/06/14 pt was stable and seen by Dr. Burt Knack and found ready for discharge. Low dose lopressor was added.   His INR 1.17 and he will follow up his INR next week, continue on home dose of coumadin. Echo results below.  He will follow up with Dr. Wynonia Lawman in 1-2 weeks and then in 30 days have follow up echo and see Dr. Burt Knack in follow up.   Pt requested robitussin with codeine to take for cough.  He has had in the past and would like to have.  I did prescribe.  Consults: cardiology and vascular surgery  Significant Diagnostic Studies:  BMET    Component Value Date/Time   NA 136* 06/05/2014 0355   K 4.1 06/05/2014 0355   CL 102 06/05/2014 0355   CO2 24 06/05/2014 0355   GLUCOSE 106* 06/05/2014 0355   BUN 13 06/05/2014 0355   CREATININE 1.12 06/05/2014 0355   CALCIUM 8.6 06/05/2014 0355   GFRNONAA 57* 06/05/2014 0355   GFRAA 67* 06/05/2014 0355    CBC    Component Value Date/Time   WBC 5.0 06/05/2014 0355   RBC 3.64* 06/05/2014 0355   HGB 11.0* 06/05/2014 0355   HCT 32.8* 06/05/2014 0355   PLT 186 06/05/2014 0355   MCV 90.1 06/05/2014 0355   MCH 30.2 06/05/2014 0355   MCHC 33.5 06/05/2014 0355   RDW 16.4* 06/05/2014 0355   LYMPHSABS 0.7 03/16/2013 1040   MONOABS 1.3* 03/16/2013 1040   EOSABS 0.0 03/16/2013 1040   BASOSABS 0.0 03/16/2013 1040    INR 1.17  EKG:  Atrial fibrillation rate of 77 Left axis deviation Abnormal ECG   Post OP Echo: Study Conclusions - Left ventricle: E/e&'>17.4 suggestive of elevated LV  filling pressures. The cavity size was normal. There was moderate concentric hypertrophy. Systolic function was normal. The estimated ejection fraction was in the range of 60% to 65%. Wall motion was normal; there were no regional wall motion abnormalities. There was a reduced contribution of atrial contraction to ventricular filling, due to increased ventricular diastolic pressure or atrial contractile dysfunction. Doppler parameters are consistent with a reversible restrictive pattern, indicative of decreased left ventricular diastolic compliance and/or increased left atrial pressure (grade 3 diastolic dysfunction). - Aortic valve: S/P TAVR with stable AVR. Mean AVG is normal at 29mmHg and there is no evidence of AI. Valve area (VTI): 2.01 cm^2. Valve area (Vmax): 1.71 cm^2. - Mitral valve: Heavily calcification of the posterior mitral valve annulus. There was mild regurgitation. - Left atrium: The atrium was severely dilated. - Tricuspid valve: There was moderate regurgitation. - Pulmonary arteries: PA peak pressure: 46 mm Hg (S).  Impressions:  - The right ventricular systolic pressure was increased consistent with moderate pulmonary hypertension.      PORTABLE CHEST - 1 VIEW  COMPARISON: Portable chest x-ray of June 04, 2014  FINDINGS:  There has been interval removal of the Swan-Ganz catheter. The  Cordis sheath on the right lies at the junction of the internal  jugular vein with the right subclavian vein. The lungs remain  hypoinflated. The interstitial markings have improved. The cardiac  silhouette remains  enlarged but it is less prominent today. The  prosthetic aortic valve cage is demonstrated. The pulmonary  vascularity is more normal. The left hemidiaphragm remains partially  obscured.  IMPRESSION:  There has been interval improvement in the appearance of the chest  consistent with improving CHF.    Discharge Exam: Blood pressure 102/60, pulse 80,  temperature 98.5 F (36.9 C), temperature source Oral, resp. rate 20, height 5' 7.5" (1.715 m), weight 162 lb 12.8 oz (73.846 kg), SpO2 94.00%.   Disposition: 01-Home or Self Care     Medication List         ascorbic Acid 500 MG Cpcr  Commonly known as:  VITAMIN C  Take 500 mg by mouth 2 (two) times daily.     aspirin 81 MG chewable tablet  Chew 1 tablet (81 mg total) by mouth daily.     BEE POLLEN PO  Take 1,740 mg by mouth daily.     digoxin 0.125 MG tablet  Commonly known as:  LANOXIN  Take 0.125 mg by mouth daily.     finasteride 5 MG tablet  Commonly known as:  PROSCAR  Take 5 mg by mouth daily.     guaiFENesin-codeine 100-10 MG/5ML syrup  Commonly known as:  ROBITUSSIN AC  Take 5 mLs by mouth 3 (three) times daily as needed for cough.     metoprolol tartrate 25 MG tablet  Commonly known as:  LOPRESSOR  Take 0.5 tablets (12.5 mg total) by mouth 2 (two) times daily.     multivitamin with minerals Tabs tablet  Take 1 tablet by mouth daily.     omeprazole 20 MG capsule  Commonly known as:  PRILOSEC  Take 20 mg by mouth daily.     Potassium 99 MG Tabs  Take 99 mg by mouth daily.     traMADol 50 MG tablet  Commonly known as:  ULTRAM  Take 1-2 tablets (50-100 mg total) by mouth every 4 (four) hours as needed for moderate pain.     warfarin 5 MG tablet  Commonly known as:  COUMADIN  Take 5 mg by mouth daily.     Zinc 50 MG Tabs  Take 50 mg by mouth daily.       Follow-up Information   Follow up with [name], MD On 07/10/2014. (at 2:00PM for echo only)    Specialty:  Cardiology   Contact information:   [address] [place] [address] [phone]       Follow up with [name], MD On 06/14/2014. (at 8:15 am to see Dr. Burt Knack)    Specialty:  Cardiology   Contact information:   [address] [place] [address] [phone]       Follow up with Kerry Hough, MD On 06/21/2014. (at 10:45 AM )     Specialty:  Cardiology   Contact information:   [place] [address] [phone]        Discharge Instructions: Have your coumadin level checked on Monday.  Heart Healthy Diet.  No strenuous activity - lifting over 5 pounds, no driving for 1 week.  Call [phone] for any questions or problems.    Signed: Isaiah Serge Nurse Practitioner-Certified [facility]: [facility] 06/06/2014, 12:40 PM  Time spent on discharge : > 35 minutes.

## 2014-06-06 NOTE — Care Management Note (Signed)
    Page 1 of 1   06/06/2014     3:36:19 PM CARE MANAGEMENT NOTE 06/06/2014  Patient:  John [name], John [name]   Account Number:  [id number]  Date Initiated:  06/06/2014  Documentation initiated by:  Siaosi Alter  Subjective/Objective Assessment:   Pt s/p TAVR on 06/04/14.  PTA, pt independent, lives with spouse.     Action/Plan:   Pt for dc home today; no home needs identified.   Anticipated DC Date:  06/06/2014   Anticipated DC Plan:  [place]  CM consult      Choice offered to / List presented to:             Status of service:  Completed, signed off Medicare Important Message given?  NA - LOS <3 / Initial given by admissions (If response is "NO", the following Medicare IM given date fields will be blank) Date Medicare IM given:   Medicare IM given by:   Date Additional Medicare IM given:   Additional Medicare IM given by:    Discharge Disposition:  HOME/SELF CARE  Per UR Regulation:  Reviewed for med. necessity/level of care/duration of stay  If discussed at [place] of Stay Meetings, dates discussed:    Comments:

## 2014-06-07 LAB — TYPE AND SCREEN
ABO/RH(D): A POS
Antibody Screen: NEGATIVE
DONOR AG TYPE: NEGATIVE
Donor AG Type: NEGATIVE
Donor AG Type: NEGATIVE
Donor AG Type: NEGATIVE
UNIT DIVISION: 0
Unit division: 0
Unit division: 0
Unit division: 0

## 2014-06-12 ENCOUNTER — Telehealth: Payer: Self-pay | Admitting: Cardiovascular Disease

## 2014-06-12 NOTE — Telephone Encounter (Signed)
I spoke with the pt and he does not want to have his echocardiogram performed in our office due to $300 facility fee. The pt would like his echo performed at Dr Thurman Coyer office. I have contacted this office and they will call me back.  The pt wanted to make Dr Burt Knack aware that when he first wakes up in the morning he feels SOB and notices AFib. When the pt gets out of bed and starts his day his symptoms resolve. The pt has also had 3 episodes of "minor aches" in his chest and when he changes position this resolves.  Also on Monday the pt went for his normal walk.  The pt developed a sharp pain in his chest, stopped walking and pain resolved.  The pt continued his walk without any other symptoms. I will forward this message to Dr Burt Knack to review.

## 2014-06-12 NOTE — Telephone Encounter (Signed)
New problem    Pt need to speak to you concerning his echocardiogram and bleeding after his valve surgery. Please call pt.

## 2014-06-12 NOTE — Telephone Encounter (Signed)
I'm sure Dr Wynonia Lawman will be happy to do Mr John [name]'s echo at his office. Reviewed symptoms and these can be evaluated when he is seen in follow-up which has been arranged in the near future.  Lauren - can you call Dr Thurman Coyer office and let them know about time window for echo? thx

## 2014-06-12 NOTE — Telephone Encounter (Signed)
I spoke with Magda Paganini at Dr Thurman Coyer office and she will schedule the pt to have echocardiogram on 07/08/14. I will cancel 9/23 Echo in our office. No precert required [employer]) per [employer].  I made Magda Paganini aware of this information.

## 2014-06-14 MED FILL — Potassium Chloride Inj 2 mEq/ML: INTRAVENOUS | Qty: 40 | Status: AC

## 2014-06-14 MED FILL — Magnesium Sulfate Inj 50%: INTRAMUSCULAR | Qty: 10 | Status: AC

## 2014-06-14 MED FILL — Insulin Regular (Human) Inj 100 Unit/ML: INTRAMUSCULAR | Qty: 1 | Status: AC

## 2014-06-14 MED FILL — Vancomycin HCl For IV Soln 10 GM (Base Equivalent): INTRAVENOUS | Qty: 1250 | Status: AC

## 2014-06-14 MED FILL — Heparin Sodium (Porcine) Inj 1000 Unit/ML: INTRAMUSCULAR | Qty: 30 | Status: AC

## 2014-07-02 ENCOUNTER — Telehealth: Payer: Self-pay | Admitting: Cardiovascular Disease

## 2014-07-02 NOTE — Telephone Encounter (Signed)
New message      Pt request to have the nurse call him back today.  He would not tell me what he wanted

## 2014-07-02 NOTE — Telephone Encounter (Signed)
Left message on machine for pt to contact the office.   

## 2014-07-02 NOTE — Telephone Encounter (Signed)
I spoke with the pt and he had his INR checked today at Dr Franco Collet office and it was 3.0.  The pt also complains of bruises on his arms.  The pt would like to know if he can stop his Aspirin at this time. Per Dr Burt Knack the pt can discontinue Aspirin. Pt aware.

## 2014-07-05 ENCOUNTER — Encounter: Payer: Self-pay | Admitting: *Deleted

## 2014-07-10 ENCOUNTER — Other Ambulatory Visit (HOSPITAL_COMMUNITY): Payer: No Typology Code available for payment source

## 2014-07-15 ENCOUNTER — Ambulatory Visit: Payer: No Typology Code available for payment source | Admitting: Cardiovascular Disease

## 2014-08-01 ENCOUNTER — Telehealth: Payer: Self-pay | Admitting: Cardiovascular Disease

## 2014-08-01 NOTE — Telephone Encounter (Signed)
New Message  Pt called request a call back wants appt ASAP. Declined PA and NP//sr

## 2014-08-01 NOTE — Telephone Encounter (Signed)
Pt has an appt scheduled with Dr Burt Knack 09/02/14. He states he is not having any urgent medical problems but is requesting a sooner appt with Dr Burt Knack.  He is aware I am forwarding this message to Dr Donivan Scull nurse to follow up with him about a sooner appt with Dr Burt Knack. He is aware that he should not expect a call back before 08/06/14.

## 2014-08-06 NOTE — Telephone Encounter (Signed)
Left message on machine for pt to contact the office.   

## 2014-08-07 NOTE — Telephone Encounter (Signed)
Appointment rescheduled to 08/09/14 with Dr Burt Knack.

## 2014-08-07 NOTE — Telephone Encounter (Signed)
Left message on machine for pt to contact the office.   

## 2014-08-09 ENCOUNTER — Encounter: Payer: Self-pay | Admitting: Cardiovascular Disease

## 2014-08-09 ENCOUNTER — Ambulatory Visit (INDEPENDENT_AMBULATORY_CARE_PROVIDER_SITE_OTHER): Payer: Medicare Other | Admitting: Cardiovascular Disease

## 2014-08-09 VITALS — BP 120/70 | HR 93 | Ht 67.7 in | Wt 158.4 lb

## 2014-08-09 DIAGNOSIS — I359 Nonrheumatic aortic valve disorder, unspecified: Secondary | ICD-10-CM

## 2014-08-09 NOTE — Patient Instructions (Signed)
Your physician recommends that you continue on your current medications as directed. Please refer to the Current Medication list given to you today.  Your physician discussed the importance of taking an antibiotic prior to any dental, gastrointestinal, genitourinary procedures to prevent damage to the heart valves from infection. You were given a prescription for an antibiotic based on current SBE prophylaxis guidelines.   Your physician has requested that you have an echocardiogram in 1 YEAR at Dr Thurman Coyer office (1-2 WEEKS prior to appointment with Dr Burt Knack). Echocardiography is a painless test that uses sound waves to create images of your heart. It provides your doctor with information about the size and shape of your heart and how well your heart's chambers and valves are working. This procedure takes approximately one hour. There are no restrictions for this procedure.  Your physician wants you to follow-up in: 1 YEAR with Dr Burt Knack. You will receive a reminder letter in the mail two months in advance. If you don't receive a letter, please call our office to schedule the follow-up appointment.

## 2014-08-09 NOTE — Progress Notes (Signed)
Background: The patient is status post TAVR 06/04/2014 for treatment of severe symptomatic aortic stenosis. He also has chronic atrial fibrillation.  HPI:  This is an 80 year old gentleman returning for followup evaluation. As above, he underwent TAVR via a transfemoral approach in August. He had an uncomplicated postoperative course. He has done well since surgery has had no problems with his recovery. His breathing is significantly improved, but he does admit to mild exertional dyspnea with moderate level activities. He's had no orthopnea or PND. He has mild leg swelling which is a chronic issue. No chest pain, heart palpitations, lightheadedness, or syncope.  Studies:  2-D echocardiogram 07/08/2014: Normal left ventricular systolic function with moderate concentric LVH, normal functioning bioprosthetic valve with no regurgitation and minimal gradient, moderate mitral annular calcification with mild mitral regurgitation, mild tricuspid regurgitation with minimal elevation of RV systolic pressure. Mean transaortic valve gradient 7 mmHg, peak transaortic valve gradient 14 mm mercury.  Outpatient Encounter Prescriptions as of 08/09/2014  Medication Sig  . ascorbic Acid (VITAMIN C) 500 MG CPCR Take 500 mg by mouth 2 (two) times daily.  Marland Kitchen BEE POLLEN PO Take 1,740 mg by mouth daily.  . digoxin (LANOXIN) 0.125 MG tablet Take 0.125 mg by mouth daily.  . finasteride (PROSCAR) 5 MG tablet Take 5 mg by mouth daily.   . metoprolol tartrate (LOPRESSOR) 25 MG tablet Take 0.5 tablets (12.5 mg total) by mouth 2 (two) times daily.  . Multiple Vitamin (MULTIVITAMIN WITH MINERALS) TABS Take 1 tablet by mouth daily.  . NON FORMULARY Take 1 capsule by mouth daily. Pantothenic ACID 500 MG DAILY  . omeprazole (PRILOSEC) 20 MG capsule Take 20 mg by mouth daily.   . Potassium 99 MG TABS Take 99 mg by mouth daily.  . vitamin B-12 (CYANOCOBALAMIN) 1000 MCG tablet Take 1,000 mcg by mouth daily.  Marland Kitchen warfarin (COUMADIN)  5 MG tablet Take 5 mg by mouth daily.  . Zinc 50 MG TABS Take 50 mg by mouth daily.  . [DISCONTINUED] guaiFENesin-codeine (ROBITUSSIN AC) 100-10 MG/5ML syrup Take 5 mLs by mouth 3 (three) times daily as needed for cough.  . [DISCONTINUED] traMADol (ULTRAM) 50 MG tablet Take 1-2 tablets (50-100 mg total) by mouth every 4 (four) hours as needed for moderate pain.    Allergies  Allergen Reactions  . Percocet [Oxycodone-Acetaminophen] Other (See Comments)    Craziness, hallucinations.    Past Medical History  Diagnosis Date  . Aortic stenosis   . Mixed hyperlipidemia 09/27/2013    Does not want to take statins   . History of left foot drop 01/02/2014  . GERD (gastroesophageal reflux disease)   . Lumbar disc disease   . BPH (benign prostatic hyperplasia)   . Hypertension   . Dysrhythmia     afib  . Heart murmur   . History of kidney stones   . 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   BP 120/70  Pulse 93  Ht 5' 7.7" (1.72 m)  Wt 158 lb 6.4 oz (71.85 kg)  BMI 24.29 kg/m2  SpO2 64%  PHYSICAL EXAM: Pt is alert and oriented, pleasant elderly male in NAD HEENT: normal Neck: JVP - normal Lungs: CTA bilaterally CV: Irregular with a grade 2/6 early peaking ejection murmur at the right upper sternal border Abd: soft, NT, Positive BS,  no hepatomegaly Ext: No edema on the right, trace edema on the left. The left leg is in a brace. Skin: Broussard/dry no rash  ASSESSMENT AND PLAN: 81 year old gentleman now approximately 2 months out from transcatheter aortic valve replacement. He has NYHA Class 2 symptoms which is a significant improvement from his preop functional status. He's had an uncomplicated postoperative course and good early recovery. His valve is functioning normally. His 30 day  echocardiogram was reviewed. I will plan on seeing the patient back for his one-year visit per protocol. At that time he should have an echo at Dr Thurman Coyer office.   [name] MD 08/09/2014 11:48 AM

## 2014-09-02 ENCOUNTER — Ambulatory Visit: Payer: Medicare Other | Admitting: Cardiovascular Disease

## 2014-09-26 ENCOUNTER — Encounter (HOSPITAL_COMMUNITY): Payer: Self-pay | Admitting: Cardiovascular Disease

## 2014-12-10 ENCOUNTER — Telehealth: Payer: Self-pay | Admitting: Cardiovascular Disease

## 2014-12-10 NOTE — Telephone Encounter (Signed)
New message      Pt states he had an aortic valve replacement 26mo ago.  Can he stop his blood thinner?

## 2014-12-10 NOTE — Telephone Encounter (Signed)
I spoke with the pt and he wanted to know if he can stop taking warfarin since he had his valve surgery.  I made the pt aware that he takes warfarin because of his Atrial Fibrillation.  I advised the pt that he cannot stop warfarin and he needs to continue his current medications. Pt agreed with plan.  The pt's primary cardiologist is Dr Wynonia Lawman.

## 2015-01-23 ENCOUNTER — Other Ambulatory Visit: Payer: Self-pay | Admitting: Family Medicine

## 2015-01-23 DIAGNOSIS — R9389 Abnormal findings on diagnostic imaging of other specified body structures: Secondary | ICD-10-CM

## 2015-03-03 ENCOUNTER — Ambulatory Visit
Admission: RE | Admit: 2015-03-03 | Discharge: 2015-03-03 | Disposition: A | Discharge status: Home / Self Care | Payer: Medicare Other | Source: Ambulatory Visit | Attending: Family Medicine | Admitting: Family Medicine

## 2015-03-03 DIAGNOSIS — R9389 Abnormal findings on diagnostic imaging of other specified body structures: Secondary | ICD-10-CM
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
## 2015-05-24 ENCOUNTER — Emergency Department (HOSPITAL_COMMUNITY)
Admission: EM | Admit: 2015-05-24 | Discharge: 2015-05-24 | Disposition: A | Discharge status: Home / Self Care | Payer: Medicare Other | Attending: Emergency Medicine | Admitting: Emergency Medicine

## 2015-05-24 ENCOUNTER — Encounter (HOSPITAL_COMMUNITY): Payer: Self-pay | Admitting: Emergency Medicine

## 2015-05-24 ENCOUNTER — Emergency Department (HOSPITAL_COMMUNITY): Payer: Medicare Other

## 2015-05-24 DIAGNOSIS — Y9289 Other specified places as the place of occurrence of the external cause: Secondary | ICD-10-CM | POA: Diagnosis not present

## 2015-05-24 DIAGNOSIS — S0181XA Laceration without foreign body of other part of head, initial encounter: Secondary | ICD-10-CM | POA: Diagnosis not present

## 2015-05-24 DIAGNOSIS — I10 Essential (primary) hypertension: Secondary | ICD-10-CM | POA: Diagnosis not present

## 2015-05-24 DIAGNOSIS — Y9389 Activity, other specified: Secondary | ICD-10-CM | POA: Diagnosis not present

## 2015-05-24 DIAGNOSIS — Z7901 Long term (current) use of anticoagulants: Secondary | ICD-10-CM | POA: Insufficient documentation

## 2015-05-24 DIAGNOSIS — E782 Mixed hyperlipidemia: Secondary | ICD-10-CM | POA: Diagnosis not present

## 2015-05-24 DIAGNOSIS — Z87442 Personal history of urinary calculi: Secondary | ICD-10-CM | POA: Insufficient documentation

## 2015-05-24 DIAGNOSIS — K219 Gastro-esophageal reflux disease without esophagitis: Secondary | ICD-10-CM | POA: Insufficient documentation

## 2015-05-24 DIAGNOSIS — R011 Cardiac murmur, unspecified: Secondary | ICD-10-CM | POA: Insufficient documentation

## 2015-05-24 DIAGNOSIS — S0990XA Unspecified injury of head, initial encounter: Secondary | ICD-10-CM | POA: Diagnosis not present

## 2015-05-24 DIAGNOSIS — Z79899 Other long term (current) drug therapy: Secondary | ICD-10-CM | POA: Insufficient documentation

## 2015-05-24 DIAGNOSIS — Y998 Other external cause status: Secondary | ICD-10-CM | POA: Diagnosis not present

## 2015-05-24 LAB — BASIC METABOLIC PANEL
ANION GAP: 9 (ref 5–15)
BUN: 17 mg/dL (ref 6–20)
CALCIUM: 8.3 mg/dL — AB (ref 8.9–10.3)
CO2: 25 mmol/L (ref 22–32)
CREATININE: 0.91 mg/dL (ref 0.61–1.24)
Chloride: 102 mmol/L (ref 101–111)
GFR calc Af Amer: >60 mL/min (ref >60)
GFR calc non Af Amer: >60 mL/min (ref >60)
Glucose, Bld: 101 mg/dL — ABNORMAL HIGH (ref 65–99)
POTASSIUM: 3.9 mmol/L (ref 3.5–5.1)
SODIUM: 136 mmol/L (ref 135–145)

## 2015-05-24 LAB — CBC
HEMATOCRIT: 41.6 % (ref 39.0–52.0)
HEMOGLOBIN: 14 g/dL (ref 13.0–17.0)
MCH: 30.5 pg (ref 26.0–34.0)
MCHC: 33.7 g/dL (ref 30.0–36.0)
MCV: 90.6 fL (ref 78.0–100.0)
PLATELETS: 208 10*3/uL (ref 150–400)
RBC: 4.59 MIL/uL (ref 4.22–5.81)
RDW: 15.4 % (ref 11.5–15.5)
WBC: 12.3 10*3/uL — ABNORMAL HIGH (ref 4.0–10.5)

## 2015-05-24 MED ORDER — SODIUM CHLORIDE 0.9 % IV BOLUS (SEPSIS)
1000.0000 mL | Freq: Once | INTRAVENOUS | Status: AC
  Administered 2015-05-24: 1000 mL via INTRAVENOUS

## 2015-05-24 MED ORDER — LIDOCAINE HCL (PF) 1 % IJ SOLN
30.0000 mL | Freq: Once | INTRAMUSCULAR | Status: AC
  Administered 2015-05-24: 30 mL via INTRADERMAL

## 2015-05-24 MED ORDER — HYDROCODONE-ACETAMINOPHEN 5-325 MG PO TABS
2.0000 | ORAL_TABLET | Freq: Once | ORAL | Status: AC
  Administered 2015-05-24: 2 via ORAL
  Filled 2015-05-24: qty 2

## 2015-05-24 MED ORDER — LIDOCAINE HCL 1 % IJ SOLN
20.0000 mL | Freq: Once | INTRAMUSCULAR | Status: DC
  Filled 2015-05-24: qty 20

## 2015-05-24 MED ORDER — METOPROLOL TARTRATE 25 MG PO TABS
25.0000 mg | ORAL_TABLET | Freq: Once | ORAL | Status: AC
  Administered 2015-05-24: 25 mg via ORAL
  Filled 2015-05-24: qty 1

## 2015-05-24 MED ORDER — SODIUM CHLORIDE 0.9 % IV BOLUS (SEPSIS)
1000.0000 mL | Freq: Once | INTRAVENOUS | Status: AC
Start: 2015-05-24 — End: 2015-05-24
  Administered 2015-05-24: 1000 mL via INTRAVENOUS

## 2015-05-24 MED ORDER — HYDROCODONE-ACETAMINOPHEN 5-325 MG PO TABS: 1.0000 | ORAL_TABLET | Freq: Four times a day (QID) | ORAL | Status: DC | PRN

## 2015-05-24 MED ORDER — LIDOCAINE HCL (PF) 1 % IJ SOLN
30.0000 mL | Freq: Once | INTRAMUSCULAR | Status: DC
Start: 2015-05-24 — End: 2015-05-25
  Filled 2015-05-24: qty 30

## 2015-05-24 NOTE — ED Notes (Signed)
Patient given bag meal, with Sprit
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
## 2015-05-24 NOTE — ED Provider Notes (Signed)
LACERATION REPAIR Performed by: Linus Mako Authorized by: Linus Mako Consent: Verbal consent obtained. Risks and benefits: risks, benefits and alternatives were discussed Consent given by: patient Patient identity confirmed: provided demographic data Prepped and Draped in normal sterile fashion Wound explored  Laceration Location: left maxillofacial  Laceration Length: 3 x 2  cm and 4 cm  No Foreign Bodies seen or palpated  Anesthesia: local infiltration  Local anesthetic: lidocaine 2% wo epinephrine  Anesthetic total: 4 ml  Irrigation method: syringe Amount of cleaning: standard  Skin closure: sutures  Number of sutures: 2 x 3 x 2 x 5  Technique: simple interrupted and for the 4 cm laceration a running suture was placed.  Patient tolerance: Patient tolerated the procedure well with no immediate complications . Patient seen by Dr. Mingo Amber, for HPI, ROS, PE and DISPO please refer to his note.  Delos Haring, PA-C 05/24/15 2206  Evelina Bucy, MD 05/24/15 [phone]

## 2015-05-24 NOTE — ED Notes (Signed)
Dr Mingo Amber in room

## 2015-05-24 NOTE — ED Notes (Signed)
Pt verbalized understanding of d/c instructions and no further questions. Pt stable and in NAD.
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
## 2015-05-24 NOTE — ED Notes (Signed)
Pt cleaned up and redressed with disposal shirt prior to discharge.

## 2015-06-05 ENCOUNTER — Encounter (HOSPITAL_COMMUNITY): Payer: Self-pay | Admitting: Cardiology

## 2015-06-05 ENCOUNTER — Emergency Department (HOSPITAL_COMMUNITY)
Admission: EM | Admit: 2015-06-05 | Discharge: 2015-06-05 | Disposition: A | Discharge status: Home / Self Care | Payer: Medicare Other | Attending: Emergency Medicine | Admitting: Emergency Medicine

## 2015-06-05 ENCOUNTER — Emergency Department (HOSPITAL_COMMUNITY): Payer: Medicare Other

## 2015-06-05 DIAGNOSIS — I10 Essential (primary) hypertension: Secondary | ICD-10-CM | POA: Insufficient documentation

## 2015-06-05 DIAGNOSIS — Z7901 Long term (current) use of anticoagulants: Secondary | ICD-10-CM | POA: Diagnosis not present

## 2015-06-05 DIAGNOSIS — R51 Headache: Secondary | ICD-10-CM | POA: Diagnosis present

## 2015-06-05 DIAGNOSIS — R011 Cardiac murmur, unspecified: Secondary | ICD-10-CM | POA: Diagnosis not present

## 2015-06-05 DIAGNOSIS — K219 Gastro-esophageal reflux disease without esophagitis: Secondary | ICD-10-CM | POA: Diagnosis not present

## 2015-06-05 DIAGNOSIS — Z8639 Personal history of other endocrine, nutritional and metabolic disease: Secondary | ICD-10-CM | POA: Diagnosis not present

## 2015-06-05 DIAGNOSIS — G8911 Acute pain due to trauma: Secondary | ICD-10-CM | POA: Diagnosis not present

## 2015-06-05 DIAGNOSIS — Z87442 Personal history of urinary calculi: Secondary | ICD-10-CM | POA: Insufficient documentation

## 2015-06-05 DIAGNOSIS — R040 Epistaxis: Secondary | ICD-10-CM | POA: Diagnosis not present

## 2015-06-05 DIAGNOSIS — Z79899 Other long term (current) drug therapy: Secondary | ICD-10-CM | POA: Diagnosis not present

## 2015-06-05 DIAGNOSIS — T1490XA Injury, unspecified, initial encounter: Secondary | ICD-10-CM

## 2015-06-05 LAB — I-STAT CHEM 8, ED
BUN: 20 mg/dL (ref 6–20)
CHLORIDE: 101 mmol/L (ref 101–111)
Calcium, Ion: 1.14 mmol/L (ref 1.13–1.30)
Creatinine, Ser: 0.9 mg/dL (ref 0.61–1.24)
GLUCOSE: 101 mg/dL — AB (ref 65–99)
HCT: 43 % (ref 39.0–52.0)
Hemoglobin: 14.6 g/dL (ref 13.0–17.0)
POTASSIUM: 4.1 mmol/L (ref 3.5–5.1)
Sodium: 137 mmol/L (ref 135–145)
TCO2: 24 mmol/L (ref 0–100)

## 2015-06-05 LAB — PROTIME-INR
INR: 2.62 — ABNORMAL HIGH (ref 0.00–1.49)
Prothrombin Time: 27.6 seconds — ABNORMAL HIGH (ref 11.6–15.2)

## 2015-06-05 MED ORDER — OXYMETAZOLINE HCL 0.05 % NA SOLN
1.0000 | Freq: Once | NASAL | Status: AC
  Administered 2015-06-05: 1 via NASAL
  Filled 2015-06-05: qty 15

## 2015-06-05 MED ORDER — ACETAMINOPHEN 500 MG PO TABS
1000.0000 mg | ORAL_TABLET | Freq: Once | ORAL | Status: DC
  Filled 2015-06-05: qty 2

## 2015-06-05 MED ORDER — LIDOCAINE-EPINEPHRINE (PF) 2 %-1:200000 IJ SOLN
10.0000 mL | Freq: Once | INTRAMUSCULAR | Status: AC
  Administered 2015-06-05: 10 mL
  Filled 2015-06-05: qty 20

## 2015-06-05 NOTE — ED Notes (Signed)
Patient transported to CT 
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
## 2015-06-05 NOTE — ED Notes (Signed)
Pt verbalizes understanding of d/c instructions and denies any further needs at this time. 
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
## 2015-06-05 NOTE — Discharge Instructions (Signed)
Your INR was 2.6 Nosebleed Nosebleeds can be caused by many conditions, including trauma, infections, polyps, foreign bodies, dry mucous membranes or climate, medicines, and air conditioning. Most nosebleeds occur in the front of the nose. Because of this location, most nosebleeds can be controlled by pinching the nostrils gently and continuously for at least 10 to 20 minutes. The long, continuous pressure allows enough time for the blood to clot. If pressure is released during that 10 to 20 minute time period, the process may have to be started again. The nosebleed may stop by itself or quit with pressure, or it may need concentrated heating (cautery) or pressure from packing. HOME CARE INSTRUCTIONS   If your nose was packed, try to maintain the pack inside until your health care provider removes it. If a gauze pack was used and it starts to fall out, gently replace it or cut the end off. Do not cut if a balloon catheter was used to pack the nose. Otherwise, do not remove unless instructed.  Avoid blowing your nose for 12 hours after treatment. This could dislodge the pack or clot and start the bleeding again.  If the bleeding starts again, sit up and bend forward, gently pinching the front half of your nose continuously for 20 minutes.  If bleeding was caused by dry mucous membranes, use over-the-counter saline nasal spray or gel. This will keep the mucous membranes moist and allow them to heal. If you must use a lubricant, choose the water-soluble variety. Use it only sparingly and not within several hours of lying down.  Do not use petroleum jelly or mineral oil, as these may drip into the lungs and cause serious problems.  Maintain humidity in your home by using less air conditioning or by using a humidifier.  Do not use aspirin or medicines which make bleeding more likely. Your health care provider can give you recommendations on this.  Resume normal activities as you are able, but try to  avoid straining, lifting, or bending at the waist for several days.  If the nosebleeds become recurrent and the cause is unknown, your health care provider may suggest laboratory tests. SEEK MEDICAL CARE IF: You have a fever. SEEK IMMEDIATE MEDICAL CARE IF:   Bleeding recurs and cannot be controlled.  There is unusual bleeding from or bruising on other parts of the body.  Nosebleeds continue.  There is any worsening of the condition which originally brought you in.  You become light-headed, feel faint, become sweaty, or vomit blood. MAKE SURE YOU:   Understand these instructions.  Will watch your condition.  Will get help right away if you are not doing well or get worse. Document Released: 07/14/2005 Document Revised: 02/18/2014 Document Reviewed: 09/04/2009 [facility] Patient Information 2015 [place], [place]. This information is not intended to replace advice given to you by your health care provider. Make sure you discuss any questions you have with your health care provider.

## 2015-06-06 ENCOUNTER — Ambulatory Visit: Payer: Medicare Other | Admitting: Cardiovascular Disease
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
## 2015-06-16 ENCOUNTER — Other Ambulatory Visit: Payer: Self-pay | Admitting: *Deleted

## 2015-06-16 ENCOUNTER — Other Ambulatory Visit (HOSPITAL_COMMUNITY): Payer: Medicare Other

## 2015-06-16 ENCOUNTER — Ambulatory Visit (INDEPENDENT_AMBULATORY_CARE_PROVIDER_SITE_OTHER): Payer: Medicare Other | Admitting: Cardiovascular Disease

## 2015-06-16 ENCOUNTER — Encounter: Payer: Self-pay | Admitting: Cardiovascular Disease

## 2015-06-16 VITALS — BP 118/82 | HR 129 | Ht 69.5 in | Wt 164.0 lb

## 2015-06-16 DIAGNOSIS — I482 Chronic atrial fibrillation: Secondary | ICD-10-CM | POA: Diagnosis not present

## 2015-06-16 DIAGNOSIS — Z952 Presence of prosthetic heart valve: Secondary | ICD-10-CM

## 2015-06-16 DIAGNOSIS — I359 Nonrheumatic aortic valve disorder, unspecified: Secondary | ICD-10-CM | POA: Diagnosis not present

## 2015-06-16 DIAGNOSIS — I350 Nonrheumatic aortic (valve) stenosis: Secondary | ICD-10-CM

## 2015-06-16 DIAGNOSIS — I4821 Permanent atrial fibrillation: Secondary | ICD-10-CM

## 2015-06-16 DIAGNOSIS — Z954 Presence of other heart-valve replacement: Secondary | ICD-10-CM

## 2015-06-16 NOTE — Patient Instructions (Signed)
Medication Instructions:  Your physician recommends that you continue on your current medications as directed. Please refer to the Current Medication list given to you today.  Labwork: No new orders.   Testing/Procedures: No new orders.   Follow-Up: Your physician recommends that you schedule a follow-up appointment as needed with Dr Burt Knack.  Continue primary cardiology follow-up with Dr Wynonia Lawman.    Any Other Special Instructions Will Be Listed Below (If Applicable).

## 2015-07-22 ENCOUNTER — Encounter: Payer: Self-pay | Admitting: *Deleted

## 2015-07-23 ENCOUNTER — Encounter: Payer: Self-pay | Admitting: Diagnostic Neuroimaging

## 2015-07-23 ENCOUNTER — Ambulatory Visit (INDEPENDENT_AMBULATORY_CARE_PROVIDER_SITE_OTHER): Payer: Medicare Other | Admitting: Diagnostic Neuroimaging

## 2015-07-23 VITALS — BP 100/62 | HR 60 | Ht 69.0 in | Wt 165.6 lb

## 2015-07-23 DIAGNOSIS — R269 Unspecified abnormalities of gait and mobility: Secondary | ICD-10-CM | POA: Diagnosis not present

## 2015-07-23 DIAGNOSIS — M5416 Radiculopathy, lumbar region: Secondary | ICD-10-CM | POA: Diagnosis not present

## 2015-07-23 DIAGNOSIS — F0781 Postconcussional syndrome: Secondary | ICD-10-CM

## 2015-07-23 NOTE — Patient Instructions (Signed)
Thank you for coming to see [place] at [facility]. I hope we have been able to provide you high quality care today.  You may receive a patient satisfaction survey over the next few weeks. We would appreciate your feedback and comments so that we may continue to improve ourselves and the health of our patients.  - continue current medications - your symptoms should gradually improve over time   ~~~~~~~~~~~~~~~~~~~~~~~~~~~~~~~~~~~~~~~~~~~~~~~~~~~~~~~~~~~~~~~~~  DR. PENUMALLI'S GUIDE TO HAPPY AND HEALTHY LIVING These are some of my general health and wellness recommendations. Some of them may apply to you better than others. Please use common sense as you try these suggestions and feel free to ask me any questions.   ACTIVITY/FITNESS Mental, social, emotional and physical stimulation are very important for brain and body health. Try learning a new activity (arts, music, language, sports, games).  Keep moving your body to the best of your abilities. You can do this at home, inside or outside, the park, community center, gym or anywhere you like. Consider a physical therapist or personal trainer to get started. Consider the app Sworkit. Fitness trackers such as smart-watches, smart-phones or Fitbits can help as well.   NUTRITION Eat more plants: colorful vegetables, nuts, seeds and berries.  Eat less sugar, salt, preservatives and processed foods.  Avoid toxins such as cigarettes and alcohol.  Drink water when you are thirsty. [place] water with a slice of lemon is an excellent morning drink to start the day.  Consider these websites for more information The Nutrition Source (https://www.henry-hernandez.biz/) Precision Nutrition (WindowBlog.ch)   RELAXATION Consider practicing mindfulness meditation or other relaxation techniques such as deep breathing, prayer, yoga, tai chi, massage. See website mindful.org or the apps Headspace or  Calm to help get started.   SLEEP Try to get at least 7-8+ hours sleep per day. Regular exercise and reduced caffeine will help you sleep better. Practice good sleep hygeine techniques. See website sleep.org for more information.   PLANNING Prepare estate planning, living will, healthcare POA documents. Sometimes this is best planned with the help of an attorney. Theconversationproject.org and agingwithdignity.org are excellent resources.
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
## 2015-08-02 ENCOUNTER — Telehealth: Payer: Self-pay | Admitting: Physician Assistant

## 2015-08-02 NOTE — Telephone Encounter (Signed)
Cardiology after hour service was paged by event monitor company due to outpatient triggered event for passing out, noted to be atrial fib with heart rate 90s. Patient has chronic atrial fibrillation, currently heart rate is controlled. Event monitor company wasn't sure if it was triggered by mistake, therefore they have attempted to contact the patient, however after 2 tries, the patient did not pick up the phone, therefore, cardiology service was notified.  I have personally contacted the patient, his wife picked up the phone, she states the patient has been doing very well. And she apologizes for accidentally triggered the event monitor. Per his wife, the event monitor is driving the patient "nuts" due to all the charging, beeping or other things they have to do to keep it up. But otherwise he is doing very well without any problem or discomfort at this time. I have informed Verdis Frederickson of event monitor company that patient did not pass out and it was triggered by mistake.  Hilbert Corrigan PA Pager: [phone]

## 2015-09-25 ENCOUNTER — Ambulatory Visit: Payer: Medicare Other | Admitting: Cardiovascular Disease

## 2015-10-17 ENCOUNTER — Ambulatory Visit
Admission: RE | Admit: 2015-10-17 | Discharge: 2015-10-17 | Disposition: A | Discharge status: Home / Self Care | Payer: Medicare Other | Source: Ambulatory Visit | Attending: Cardiology | Admitting: Cardiology

## 2015-10-17 ENCOUNTER — Other Ambulatory Visit: Payer: Self-pay | Admitting: Cardiology

## 2015-10-17 DIAGNOSIS — R0602 Shortness of breath: Secondary | ICD-10-CM

## 2015-11-10 DIAGNOSIS — Z008 Encounter for other general examination: Secondary | ICD-10-CM | POA: Diagnosis not present

## 2015-11-26 DIAGNOSIS — Z008 Encounter for other general examination: Secondary | ICD-10-CM | POA: Diagnosis not present

## 2015-11-27 DIAGNOSIS — M545 Low back pain: Secondary | ICD-10-CM | POA: Diagnosis not present

## 2015-11-27 DIAGNOSIS — M5417 Radiculopathy, lumbosacral region: Secondary | ICD-10-CM | POA: Diagnosis not present

## 2015-11-27 DIAGNOSIS — M9903 Segmental and somatic dysfunction of lumbar region: Secondary | ICD-10-CM | POA: Diagnosis not present

## 2015-12-01 DIAGNOSIS — H43813 Vitreous degeneration, bilateral: Secondary | ICD-10-CM | POA: Diagnosis not present

## 2015-12-01 DIAGNOSIS — H353212 Exudative age-related macular degeneration, right eye, with inactive choroidal neovascularization: Secondary | ICD-10-CM | POA: Diagnosis not present

## 2015-12-01 DIAGNOSIS — H353122 Nonexudative age-related macular degeneration, left eye, intermediate dry stage: Secondary | ICD-10-CM | POA: Diagnosis not present

## 2015-12-03 DIAGNOSIS — M545 Low back pain: Secondary | ICD-10-CM | POA: Diagnosis not present

## 2015-12-03 DIAGNOSIS — M5417 Radiculopathy, lumbosacral region: Secondary | ICD-10-CM | POA: Diagnosis not present

## 2015-12-03 DIAGNOSIS — M9903 Segmental and somatic dysfunction of lumbar region: Secondary | ICD-10-CM | POA: Diagnosis not present

## 2015-12-05 DIAGNOSIS — M9903 Segmental and somatic dysfunction of lumbar region: Secondary | ICD-10-CM | POA: Diagnosis not present

## 2015-12-05 DIAGNOSIS — M5417 Radiculopathy, lumbosacral region: Secondary | ICD-10-CM | POA: Diagnosis not present

## 2015-12-05 DIAGNOSIS — M545 Low back pain: Secondary | ICD-10-CM | POA: Diagnosis not present

## 2015-12-08 DIAGNOSIS — M5417 Radiculopathy, lumbosacral region: Secondary | ICD-10-CM | POA: Diagnosis not present

## 2015-12-08 DIAGNOSIS — M9903 Segmental and somatic dysfunction of lumbar region: Secondary | ICD-10-CM | POA: Diagnosis not present

## 2015-12-08 DIAGNOSIS — M545 Low back pain: Secondary | ICD-10-CM | POA: Diagnosis not present

## 2015-12-10 DIAGNOSIS — M5417 Radiculopathy, lumbosacral region: Secondary | ICD-10-CM | POA: Diagnosis not present

## 2015-12-10 DIAGNOSIS — M9903 Segmental and somatic dysfunction of lumbar region: Secondary | ICD-10-CM | POA: Diagnosis not present

## 2015-12-10 DIAGNOSIS — M545 Low back pain: Secondary | ICD-10-CM | POA: Diagnosis not present

## 2015-12-11 DIAGNOSIS — M9903 Segmental and somatic dysfunction of lumbar region: Secondary | ICD-10-CM | POA: Diagnosis not present

## 2015-12-11 DIAGNOSIS — M545 Low back pain: Secondary | ICD-10-CM | POA: Diagnosis not present

## 2015-12-11 DIAGNOSIS — M5417 Radiculopathy, lumbosacral region: Secondary | ICD-10-CM | POA: Diagnosis not present

## 2015-12-12 DIAGNOSIS — M9903 Segmental and somatic dysfunction of lumbar region: Secondary | ICD-10-CM | POA: Diagnosis not present

## 2015-12-12 DIAGNOSIS — M545 Low back pain: Secondary | ICD-10-CM | POA: Diagnosis not present

## 2015-12-12 DIAGNOSIS — M5417 Radiculopathy, lumbosacral region: Secondary | ICD-10-CM | POA: Diagnosis not present

## 2015-12-15 DIAGNOSIS — M545 Low back pain: Secondary | ICD-10-CM | POA: Diagnosis not present

## 2015-12-15 DIAGNOSIS — M5417 Radiculopathy, lumbosacral region: Secondary | ICD-10-CM | POA: Diagnosis not present

## 2015-12-15 DIAGNOSIS — M9903 Segmental and somatic dysfunction of lumbar region: Secondary | ICD-10-CM | POA: Diagnosis not present

## 2015-12-17 DIAGNOSIS — M545 Low back pain: Secondary | ICD-10-CM | POA: Diagnosis not present

## 2015-12-17 DIAGNOSIS — M9903 Segmental and somatic dysfunction of lumbar region: Secondary | ICD-10-CM | POA: Diagnosis not present

## 2015-12-17 DIAGNOSIS — M5417 Radiculopathy, lumbosacral region: Secondary | ICD-10-CM | POA: Diagnosis not present

## 2015-12-19 DIAGNOSIS — M9903 Segmental and somatic dysfunction of lumbar region: Secondary | ICD-10-CM | POA: Diagnosis not present

## 2015-12-19 DIAGNOSIS — M5417 Radiculopathy, lumbosacral region: Secondary | ICD-10-CM | POA: Diagnosis not present

## 2015-12-19 DIAGNOSIS — M545 Low back pain: Secondary | ICD-10-CM | POA: Diagnosis not present

## 2015-12-22 DIAGNOSIS — M545 Low back pain: Secondary | ICD-10-CM | POA: Diagnosis not present

## 2015-12-22 DIAGNOSIS — M9903 Segmental and somatic dysfunction of lumbar region: Secondary | ICD-10-CM | POA: Diagnosis not present

## 2015-12-22 DIAGNOSIS — M5417 Radiculopathy, lumbosacral region: Secondary | ICD-10-CM | POA: Diagnosis not present

## 2015-12-24 DIAGNOSIS — M5417 Radiculopathy, lumbosacral region: Secondary | ICD-10-CM | POA: Diagnosis not present

## 2015-12-24 DIAGNOSIS — M9903 Segmental and somatic dysfunction of lumbar region: Secondary | ICD-10-CM | POA: Diagnosis not present

## 2015-12-24 DIAGNOSIS — M545 Low back pain: Secondary | ICD-10-CM | POA: Diagnosis not present

## 2015-12-25 DIAGNOSIS — K5289 Other specified noninfective gastroenteritis and colitis: Secondary | ICD-10-CM | POA: Diagnosis not present

## 2015-12-25 DIAGNOSIS — R197 Diarrhea, unspecified: Secondary | ICD-10-CM | POA: Diagnosis not present

## 2015-12-26 DIAGNOSIS — M5417 Radiculopathy, lumbosacral region: Secondary | ICD-10-CM | POA: Diagnosis not present

## 2015-12-26 DIAGNOSIS — M545 Low back pain: Secondary | ICD-10-CM | POA: Diagnosis not present

## 2015-12-26 DIAGNOSIS — M9903 Segmental and somatic dysfunction of lumbar region: Secondary | ICD-10-CM | POA: Diagnosis not present

## 2015-12-29 DIAGNOSIS — M5417 Radiculopathy, lumbosacral region: Secondary | ICD-10-CM | POA: Diagnosis not present

## 2015-12-29 DIAGNOSIS — M9903 Segmental and somatic dysfunction of lumbar region: Secondary | ICD-10-CM | POA: Diagnosis not present

## 2015-12-29 DIAGNOSIS — M545 Low back pain: Secondary | ICD-10-CM | POA: Diagnosis not present

## 2016-01-06 DIAGNOSIS — Z008 Encounter for other general examination: Secondary | ICD-10-CM | POA: Diagnosis not present

## 2016-01-23 DIAGNOSIS — M7542 Impingement syndrome of left shoulder: Secondary | ICD-10-CM | POA: Diagnosis not present

## 2016-01-26 DIAGNOSIS — I350 Nonrheumatic aortic (valve) stenosis: Secondary | ICD-10-CM | POA: Diagnosis not present

## 2016-01-26 DIAGNOSIS — Z952 Presence of prosthetic heart valve: Secondary | ICD-10-CM | POA: Diagnosis not present

## 2016-01-26 DIAGNOSIS — Z7901 Long term (current) use of anticoagulants: Secondary | ICD-10-CM | POA: Diagnosis not present

## 2016-01-26 DIAGNOSIS — I480 Paroxysmal atrial fibrillation: Secondary | ICD-10-CM | POA: Diagnosis not present

## 2016-01-26 DIAGNOSIS — R0602 Shortness of breath: Secondary | ICD-10-CM | POA: Diagnosis not present

## 2016-01-26 DIAGNOSIS — K219 Gastro-esophageal reflux disease without esophagitis: Secondary | ICD-10-CM | POA: Diagnosis not present

## 2016-02-02 DIAGNOSIS — D3132 Benign neoplasm of left choroid: Secondary | ICD-10-CM | POA: Diagnosis not present

## 2016-02-02 DIAGNOSIS — H353211 Exudative age-related macular degeneration, right eye, with active choroidal neovascularization: Secondary | ICD-10-CM | POA: Diagnosis not present

## 2016-02-02 DIAGNOSIS — H353122 Nonexudative age-related macular degeneration, left eye, intermediate dry stage: Secondary | ICD-10-CM | POA: Diagnosis not present

## 2016-02-02 DIAGNOSIS — H43813 Vitreous degeneration, bilateral: Secondary | ICD-10-CM | POA: Diagnosis not present

## 2016-02-04 DIAGNOSIS — M4692 Unspecified inflammatory spondylopathy, cervical region: Secondary | ICD-10-CM | POA: Diagnosis not present

## 2016-02-04 DIAGNOSIS — M542 Cervicalgia: Secondary | ICD-10-CM | POA: Diagnosis not present

## 2016-02-04 DIAGNOSIS — M19212 Secondary osteoarthritis, left shoulder: Secondary | ICD-10-CM | POA: Diagnosis not present

## 2016-02-04 DIAGNOSIS — M75122 Complete rotator cuff tear or rupture of left shoulder, not specified as traumatic: Secondary | ICD-10-CM | POA: Diagnosis not present

## 2016-02-12 ENCOUNTER — Telehealth: Payer: Self-pay | Admitting: Diagnostic Neuroimaging

## 2016-02-12 NOTE — Telephone Encounter (Signed)
John [name] saw you last 2016. We have received a referral for him and he would like to see another doctor. Is it ok to switch? Please advise.

## 2016-02-19 DIAGNOSIS — Z9181 History of falling: Secondary | ICD-10-CM | POA: Diagnosis not present

## 2016-02-19 DIAGNOSIS — M542 Cervicalgia: Secondary | ICD-10-CM | POA: Diagnosis not present

## 2016-02-19 DIAGNOSIS — M5412 Radiculopathy, cervical region: Secondary | ICD-10-CM | POA: Diagnosis not present

## 2016-02-19 DIAGNOSIS — R42 Dizziness and giddiness: Secondary | ICD-10-CM | POA: Diagnosis not present

## 2016-02-19 DIAGNOSIS — S0990XA Unspecified injury of head, initial encounter: Secondary | ICD-10-CM | POA: Diagnosis not present

## 2016-02-19 DIAGNOSIS — H532 Diplopia: Secondary | ICD-10-CM | POA: Diagnosis not present

## 2016-02-19 DIAGNOSIS — R269 Unspecified abnormalities of gait and mobility: Secondary | ICD-10-CM | POA: Diagnosis not present

## 2016-02-19 DIAGNOSIS — M21372 Foot drop, left foot: Secondary | ICD-10-CM | POA: Diagnosis not present

## 2016-02-23 ENCOUNTER — Ambulatory Visit: Payer: Medicare Other | Admitting: Neurology

## 2016-02-27 ENCOUNTER — Ambulatory Visit: Payer: Medicare Other | Admitting: Neurology

## 2016-02-27 DIAGNOSIS — Z7901 Long term (current) use of anticoagulants: Secondary | ICD-10-CM | POA: Diagnosis not present

## 2016-02-27 DIAGNOSIS — M545 Low back pain: Secondary | ICD-10-CM | POA: Diagnosis not present

## 2016-02-27 DIAGNOSIS — I482 Chronic atrial fibrillation: Secondary | ICD-10-CM | POA: Diagnosis not present

## 2016-03-02 ENCOUNTER — Ambulatory Visit: Payer: Medicare HMO | Attending: Unknown Physician Specialty | Admitting: Physical Therapy

## 2016-03-02 DIAGNOSIS — R2681 Unsteadiness on feet: Secondary | ICD-10-CM | POA: Insufficient documentation

## 2016-03-02 DIAGNOSIS — R2689 Other abnormalities of gait and mobility: Secondary | ICD-10-CM | POA: Diagnosis not present

## 2016-03-02 DIAGNOSIS — M542 Cervicalgia: Secondary | ICD-10-CM | POA: Diagnosis not present
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
## 2016-03-08 ENCOUNTER — Ambulatory Visit: Payer: Medicare HMO | Admitting: Physical Therapy

## 2016-03-08 DIAGNOSIS — R2689 Other abnormalities of gait and mobility: Secondary | ICD-10-CM | POA: Diagnosis not present

## 2016-03-08 DIAGNOSIS — M542 Cervicalgia: Secondary | ICD-10-CM

## 2016-03-08 DIAGNOSIS — R2681 Unsteadiness on feet: Secondary | ICD-10-CM

## 2016-03-08 NOTE — Therapy (Signed)
[facility] [address]  [place], [place], [address] Phone: [phone]   Fax:  [phone]  Physical Therapy Treatment  Patient Details  Name: John [name] MRN: [id number] Date of Birth: May 05, 1926 Referring Provider: Julian Reil, MD  Encounter Date: 03/08/2016      PT End of Session - 03/08/16 1535    Visit Number 2   Number of Visits 15   Date for PT Re-Evaluation 04/30/16   PT Start Time [id number]   PT Stop Time 1623   PT Time Calculation (min) 49 min      Past Medical History  Diagnosis Date  . Aortic stenosis   . Mixed hyperlipidemia 09/27/2013    Does not want to take statins   . History of left foot drop 01/02/2014  . GERD (gastroesophageal reflux disease)   . Lumbar disc disease   . BPH (benign prostatic hyperplasia)   . Hypertension   . Dysrhythmia     afib  . Heart murmur   . History of kidney stones   . 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 Atrial fibrillation [facility])     Past Surgical History  Procedure Laterality Date  . Lumbar laminectomy      29 yrs ago  . Total knee arthroplasty Bilateral [id number]  . Cervical laminectomy  2009  . Shoulder surgery Right 11  . Eye surgery Bilateral 09  . Transcatheter aortic valve replacement, transfemoral N/A 06/04/2014    Procedure: TRANSCATHETER AORTIC VALVE REPLACEMENT, TRANSFEMORAL;  Surgeon: [name], MD;  Location: [place];  Service: Open Heart Surgery;  Laterality: N/A;  . Intraoperative transesophageal echocardiogram N/A 06/04/2014    Procedure: INTRAOPERATIVE TRANSESOPHAGEAL ECHOCARDIOGRAM;  Surgeon: [name], MD;  Location: [facility] OR;  Service: Open Heart Surgery;  Laterality: N/A;  . Left and right heart catheterization with coronary angiogram N/A 04/22/2014    Procedure: LEFT AND RIGHT HEART CATHETERIZATION WITH  CORONARY ANGIOGRAM;  Surgeon: Blane Ohara, MD;  Location: [facility] CATH LAB;  Service: Cardiovascular;  Laterality: N/A;  . Skull surgery  2013    "plate on right side of head"    There were no vitals filed for this visit.      Subjective Assessment - 03/08/16 1536    Subjective Rates neck pain 5-6/10 today.   Currently in Pain? Yes   Pain Score --  5-6/10   Pain Location Neck   Pain Orientation Right;Lateral           TODAY'S TREATMENT Manual - TPR R UT with pt supine due to c/o increased pain in this area today vs last week Gentle manual stretching to same  Neuro - TRX DL Squat 12x (working to keep wt on heels and performing with slow controlled motion) TRX ALT lateral lunges 8x each (focus on proper LE wt shifting, strong tendency to maintain posterior pelvic tilt and to rely too much on UE assist with TRX) Facing wall ALT low diagonal reaching 6x each (initially very difficult but was able to perform with much larger reach by last few reps) close SBA 9" step toe-tapping 10x each CGA Sit->stand from edge of plinth with no UE Assist: normal stance 6x, narrow stance 3x, staggered stance 3x each (all required multiple attempts to perform) close SBA Staggered Standing one arm row with F/B wt shifting, Green TB, Min to [employer] A  throughout exercise for balance and maintaining proper wt shifting            PT Short Term Goals - 03/08/16 1540    PT SHORT TERM GOAL #1   Title Pt will be independent with initial cervical HEP by 03/23/16   Status On-going   PT SHORT TERM GOAL #2   Title Pt will demonstrate awareness of appropriate placement and sequencing of cane with gait by 03/23/16   Status On-going           PT Long Term Goals - 03/08/16 1540    PT LONG TERM GOAL #1   Title Pt will be independent with advanced HEP as indicated by 04/30/16   Status On-going   PT LONG TERM GOAL #2   Title Pt will consistently demonstrate normal gait pattern with cane during gait by  04/30/16   Status On-going   PT LONG TERM GOAL #3   Title Pt will improve Berg Balance Scale score to >/= 28/56 to reduce risk for falls by 04/30/16   Status On-going   PT LONG TERM GOAL #4   Title Pt will report improved ability to turn head while driving by [id number]   Status On-going               Plan - 03/08/16 1635    Clinical Impression Statement Treatment today address neck pain with manually initially due to c/o increased pain there today since initial eval.  Otherwise treatment focused on LE functional strengthening with proprioception training.  [name] has exceptionally poor dynamic balance and requires CGA to Mod A with nearly every standing exercise.  He has sufficient strength in B LE for good squats with TRX assist yet in staggered standing he can't wt shift between legs without Min to Mod A from PT to maintain balance and upright posture.    PT Next Visit Plan Review pt's self-created HEP and correct/modify pacing/technique as appropriate; Provide initial cervical HEP; Review gait training with cane   Consulted and Agree with Plan of Care Patient      Patient will benefit from skilled therapeutic intervention in order to improve the following deficits and impairments:  Decreased balance, Decreased coordination, Abnormal gait, Difficulty walking, Decreased strength, Decreased safety awareness, Decreased knowledge of use of DME, Decreased range of motion, Impaired flexibility, Pain, Postural dysfunction, Improper body mechanics  Visit Diagnosis: Other abnormalities of gait and mobility  Unsteadiness on feet  Cervicalgia     Problem List Patient Active Problem List   Diagnosis Date Noted  . Post concussion syndrome 07/23/2015  . Gait difficulty 07/23/2015  . Lumbar radiculopathy 07/23/2015  . Aortic valve disorders 06/04/2014  . 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 Severe aortic stenosis 04/24/2014  . Aortic stenosis, severe  04/04/2014  . Long-term (current) use of anticoagulants   . History of left foot drop 01/02/2014  . Leg length discrepancy 01/02/2014  . Mixed hyperlipidemia 09/27/2013  . Atrial fibrillation, permanent ([place])     Meshach Perry PT, OCS 03/08/2016, 4:41 PM  [facility] [address]  [place], [place], [address] Phone: [phone]   Fax:  [phone]  Name: Monolito Oravetz MRN: [id number] Date of Birth: 1926-07-14

## 2016-03-10 ENCOUNTER — Ambulatory Visit: Payer: Medicare HMO | Admitting: Physical Therapy

## 2016-03-10 DIAGNOSIS — M542 Cervicalgia: Secondary | ICD-10-CM

## 2016-03-10 DIAGNOSIS — R2689 Other abnormalities of gait and mobility: Secondary | ICD-10-CM

## 2016-03-10 DIAGNOSIS — R2681 Unsteadiness on feet: Secondary | ICD-10-CM

## 2016-03-10 NOTE — Therapy (Signed)
John [name] [address]  [place], [place], [address] Phone: [phone]   Fax:  [phone]  Physical Therapy Treatment  Patient Details  Name: John [name] MRN: [id number] Date of Birth: December 18, 1925 Referring Provider: Julian Reil, MD  Encounter Date: 03/10/2016      PT End of Session - 03/10/16 0947    Visit Number 3   Number of Visits 15   Date for PT Re-Evaluation 04/30/16   PT Start Time 0932   PT Stop Time 1013   PT Time Calculation (min) 41 min   Activity Tolerance Patient tolerated treatment well   Behavior During Therapy [facility] for tasks assessed/performed      Past Medical History  Diagnosis Date  . Aortic stenosis   . Mixed hyperlipidemia 09/27/2013    Does not want to take statins   . History of left foot drop 01/02/2014  . GERD (gastroesophageal reflux disease)   . Lumbar disc disease   . BPH (benign prostatic hyperplasia)   . Hypertension   . Dysrhythmia     afib  . Heart murmur   . History of kidney stones   . 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 Atrial fibrillation [facility])     Past Surgical History  Procedure Laterality Date  . Lumbar laminectomy      29 yrs ago  . Total knee arthroplasty Bilateral [id number]  . Cervical laminectomy  2009  . Shoulder surgery Right 11  . Eye surgery Bilateral 09  . Transcatheter aortic valve replacement, transfemoral N/A 06/04/2014    Procedure: TRANSCATHETER AORTIC VALVE REPLACEMENT, TRANSFEMORAL;  Surgeon: [name], MD;  Location: [place];  Service: Open Heart Surgery;  Laterality: N/A;  . Intraoperative transesophageal echocardiogram N/A 06/04/2014    Procedure: INTRAOPERATIVE TRANSESOPHAGEAL ECHOCARDIOGRAM;  Surgeon: [name], MD;  Location: [facility] OR;  Service: Open Heart Surgery;  Laterality: N/A;  . Left and right  heart catheterization with coronary angiogram N/A 04/22/2014    Procedure: LEFT AND RIGHT HEART CATHETERIZATION WITH CORONARY ANGIOGRAM;  Surgeon: Blane Ohara, MD;  Location: [facility] CATH LAB;  Service: Cardiovascular;  Laterality: N/A;  . Skull surgery  2013    "plate on right side of head"    There were no vitals filed for this visit.      Subjective Assessment - 03/10/16 0938    Subjective Pt states neck pain is less today after TRP at last visit.   Currently in Pain? Yes   Pain Score 4    Pain Location Neck           TODAY'S TREATMENT  Manual TPR R UT with pt supine  Gentle manual stretching to same  TherEx Hooklying    Chest/pec stretch x1'   B Shoulder Horiz ABD with red TB 10x3"   B Shoulder ER with     Chin tuck with towel roll under neck x10 Seated   Chin tuck x10   UT stretch 2x30"   B Shoulder Rows with red TB 10x3"              PT Short Term Goals - 03/08/16 1540    PT SHORT TERM GOAL #1   Title Pt will be independent with initial cervical HEP by 03/23/16   Status On-going   PT SHORT TERM GOAL #2   Title Pt will demonstrate  awareness of appropriate placement and sequencing of cane with gait by 03/23/16   Status On-going           PT Long Term Goals - 03/08/16 1540    PT LONG TERM GOAL #1   Title Pt will be independent with advanced HEP as indicated by 04/30/16   Status On-going   PT LONG TERM GOAL #2   Title Pt will consistently demonstrate normal gait pattern with cane during gait by 04/30/16   Status On-going   PT LONG TERM GOAL #3   Title Pt will improve Berg Balance Scale score to >/= 28/56 to reduce risk for falls by 04/30/16   Status On-going   PT LONG TERM GOAL #4   Title Pt will report improved ability to turn head while driving by [id number]   Status On-going               Plan - 03/10/16 1013    Clinical Impression Statement Pt reporting decrease in neck pain after TPR and stretching at last visit, but continued tightness  noted with additional TP, therefore session initated withmore manual work in this area. Remainder of treatment focused on creation of HEP for stretching and postural exercises targeting cervical spine with pt able to perform all exercises appropriately. Will f/u at next visit to address anu potential concerns with new HEP as wel as review pr's self created back/lower body HEP and correct/modify as appropriate. Per pt preference, remainder of visits will focus on balabce and gait stability unless further issues arise related to neck pain.   PT Next Visit Plan Review cervical HEP & pt's self-created HEP and correct/modify pacing/technique as appropriate; Review gait training with cane; Balance/gait stability training   Consulted and Agree with Plan of Care Patient      Patient will benefit from skilled therapeutic intervention in order to improve the following deficits and impairments:  Decreased balance, Decreased coordination, Abnormal gait, Difficulty walking, Decreased strength, Decreased safety awareness, Decreased knowledge of use of DME, Decreased range of motion, Impaired flexibility, Pain, Postural dysfunction, Improper body mechanics  Visit Diagnosis: Other abnormalities of gait and mobility  Unsteadiness on feet  Cervicalgia     Problem List Patient Active Problem List   Diagnosis Date Noted  . Post concussion syndrome 07/23/2015  . Gait difficulty 07/23/2015  . Lumbar radiculopathy 07/23/2015  . Aortic valve disorders 06/04/2014  . 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 Severe aortic stenosis 04/24/2014  . Aortic stenosis, severe 04/04/2014  . Long-term (current) use of anticoagulants   . History of left foot drop 01/02/2014  . Leg length discrepancy 01/02/2014  . Mixed hyperlipidemia 09/27/2013  . Atrial fibrillation, permanent ([place])     Percival Spanish, PT, MPT 03/10/2016, 10:21 AM  [facility] [address]  [place], [place], [address] Phone: [phone]   Fax:  [phone]  Name: John [name] MRN: [id number] Date of Birth: April 18, 1926

## 2016-03-18 ENCOUNTER — Ambulatory Visit: Payer: Medicare HMO | Attending: Unknown Physician Specialty | Admitting: Physical Therapy

## 2016-03-18 DIAGNOSIS — R2689 Other abnormalities of gait and mobility: Secondary | ICD-10-CM | POA: Diagnosis not present

## 2016-03-18 DIAGNOSIS — R2681 Unsteadiness on feet: Secondary | ICD-10-CM | POA: Diagnosis not present

## 2016-03-18 DIAGNOSIS — M542 Cervicalgia: Secondary | ICD-10-CM | POA: Diagnosis not present

## 2016-03-18 NOTE — Therapy (Signed)
[facility] [address]  [place], [place], [address] Phone: [phone]   Fax:  [phone]  Physical Therapy Treatment  Patient Details  Name: John [name] MRN: [id number] Date of Birth: October 01, 1926 Referring Provider: Julian Reil, MD  Encounter Date: 03/18/2016      PT End of Session - 03/18/16 1458    Visit Number 4   Number of Visits 15   Date for PT Re-Evaluation 04/30/16   PT Start Time [id number]   PT Stop Time 1535   PT Time Calculation (min) 50 min   Activity Tolerance Patient tolerated treatment well   Behavior During Therapy [facility] for tasks assessed/performed      Past Medical History  Diagnosis Date  . Aortic stenosis   . Mixed hyperlipidemia 09/27/2013    Does not want to take statins   . History of left foot drop 01/02/2014  . GERD (gastroesophageal reflux disease)   . Lumbar disc disease   . BPH (benign prostatic hyperplasia)   . Hypertension   . Dysrhythmia     afib  . Heart murmur   . History of kidney stones   . 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 Atrial fibrillation [facility])     Past Surgical History  Procedure Laterality Date  . Lumbar laminectomy      29 yrs ago  . Total knee arthroplasty Bilateral [id number]  . Cervical laminectomy  2009  . Shoulder surgery Right 11  . Eye surgery Bilateral 09  . Transcatheter aortic valve replacement, transfemoral N/A 06/04/2014    Procedure: TRANSCATHETER AORTIC VALVE REPLACEMENT, TRANSFEMORAL;  Surgeon: [name], MD;  Location: [place];  Service: Open Heart Surgery;  Laterality: N/A;  . Intraoperative transesophageal echocardiogram N/A 06/04/2014    Procedure: INTRAOPERATIVE TRANSESOPHAGEAL ECHOCARDIOGRAM;  Surgeon: [name], MD;  Location: [facility] OR;  Service: Open Heart Surgery;  Laterality: N/A;  . Left and right  heart catheterization with coronary angiogram N/A 04/22/2014    Procedure: LEFT AND RIGHT HEART CATHETERIZATION WITH CORONARY ANGIOGRAM;  Surgeon: Blane Ohara, MD;  Location: [facility] CATH LAB;  Service: Cardiovascular;  Laterality: N/A;  . Skull surgery  2013    "plate on right side of head"    There were no vitals filed for this visit.      Subjective Assessment - 03/18/16 1454    Subjective Pt reports as long as he uses his Icy/Hot, the neck pain remains well controlled. Reports attempting to complete HEP 1x/day, sometimes 2x/day, with only issue is slight discomfort in L shoulder with horizontal abduction.   Currently in Pain? Yes   Pain Score --  1-2/10   Pain Location Neck           TODAY'S TREATMENT  TherEx NuStep - lvl 4 x 5' Bridge + Hip ABD isometric with black TB 2x10 Hooklying Alternating Hip ABD/ER with black TB 2x10 B Sidelying Hip ABD/ER clam with black TB x10 Standing at back of chair:   PWR! Up x10   PWR! Rock x10   PWR! Twist - attempted but pt with difficulty coordinating movement   PWR! Step x10         PT Education - 03/18/16 1839    Education provided Yes   Education Details LE stretches and core strengthening   Person(s) Educated Patient   Methods Explanation;Demonstration  Comprehension Verbalized understanding;Returned demonstration;Need further instruction          PT Short Term Goals - 03/18/16 1852    PT SHORT TERM GOAL #1   Title Pt will be independent with initial cervical HEP by 03/23/16   Status On-going   PT SHORT TERM GOAL #2   Title Pt will demonstrate awareness of appropriate placement and sequencing of cane with gait by 03/23/16   Status On-going           PT Long Term Goals - 03/18/16 1852    PT LONG TERM GOAL #1   Title Pt will be independent with advanced HEP as indicated by 04/30/16   Status On-going   PT LONG TERM GOAL #2   Title Pt will consistently demonstrate normal gait pattern with cane during gait by  04/30/16   Status On-going   PT LONG TERM GOAL #3   Title Pt will improve Berg Balance Scale score to >/= 28/56 to reduce risk for falls by 04/30/16   Status On-going   PT LONG TERM GOAL #4   Title Pt will report improved ability to turn head while driving by [id number]   Status On-going               Plan - 03/18/16 1617    Clinical Impression Statement Pt reporting neck pain better controlled with Icy/Hot application daily and able to perform initial cervical HEP exercises without issue other than slight irritation noted in L shoulder from horizontal ABD. Reviewed pt's self-created lower body exercise program and made modifications and additions with handout provided. Incorporated standing PWR! Moves to target upright posture and balance control in standing but pt not yet able to perform these activities well enough to try on own at home. Provided instruction in proper use of SPC with placement and sequencing of SPC as pt wants to use cane to "propel" himself forward.   PT Next Visit Plan Review HEP's and correct/modify pacing/technique as appropriate; Review gait training with cane; Balance/gait stability training   Consulted and Agree with Plan of Care Patient      Patient will benefit from skilled therapeutic intervention in order to improve the following deficits and impairments:  Decreased balance, Decreased coordination, Abnormal gait, Difficulty walking, Decreased strength, Decreased safety awareness, Decreased knowledge of use of DME, Decreased range of motion, Impaired flexibility, Pain, Postural dysfunction, Improper body mechanics  Visit Diagnosis: Other abnormalities of gait and mobility  Unsteadiness on feet  Cervicalgia     Problem List Patient Active Problem List   Diagnosis Date Noted  . Post concussion syndrome 07/23/2015  . Gait difficulty 07/23/2015  . Lumbar radiculopathy 07/23/2015  . Aortic valve disorders 06/04/2014  . S/P TAVR (transcatheter aortic  valve replacement) 06/04/2014  . Severe aortic stenosis 04/24/2014  . Aortic stenosis, severe 04/04/2014  . Long-term (current) use of anticoagulants   . History of left foot drop 01/02/2014  . Leg length discrepancy 01/02/2014  . Mixed hyperlipidemia 09/27/2013  . Atrial fibrillation, permanent ([place])     Percival Spanish, PT, MPT 03/18/2016, 6:57 PM  [facility] [address]  [place], [place], [address] Phone: [phone]   Fax:  [phone]  Name: John [name] MRN: [id number] Date of Birth: August 23, 1926

## 2016-03-22 ENCOUNTER — Ambulatory Visit: Payer: Medicare HMO | Admitting: Physical Therapy

## 2016-03-22 DIAGNOSIS — R2689 Other abnormalities of gait and mobility: Secondary | ICD-10-CM | POA: Diagnosis not present

## 2016-03-22 DIAGNOSIS — R2681 Unsteadiness on feet: Secondary | ICD-10-CM | POA: Diagnosis not present

## 2016-03-22 DIAGNOSIS — M542 Cervicalgia: Secondary | ICD-10-CM | POA: Diagnosis not present

## 2016-03-22 DIAGNOSIS — Z7901 Long term (current) use of anticoagulants: Secondary | ICD-10-CM | POA: Diagnosis not present

## 2016-03-22 NOTE — Therapy (Signed)
[facility] [address]  [place], [place], [address] Phone: [phone]   Fax:  [phone]  Physical Therapy Treatment  Patient Details  Name: John [name] MRN: [id number] Date of Birth: 1926-07-10 Referring Provider: Julian Reil, MD  Encounter Date: 03/22/2016      PT End of Session - 03/22/16 1029    Visit Number 5   Number of Visits 15   Date for PT Re-Evaluation 04/30/16   PT Start Time 1020   PT Stop Time 1107   PT Time Calculation (min) 47 min   Activity Tolerance Patient tolerated treatment well   Behavior During Therapy [facility] for tasks assessed/performed      Past Medical History  Diagnosis Date  . Aortic stenosis   . Mixed hyperlipidemia 09/27/2013    Does not want to take statins   . History of left foot drop 01/02/2014  . GERD (gastroesophageal reflux disease)   . Lumbar disc disease   . BPH (benign prostatic hyperplasia)   . Hypertension   . Dysrhythmia     afib  . Heart murmur   . History of kidney stones   . 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 Atrial fibrillation [facility])     Past Surgical History  Procedure Laterality Date  . Lumbar laminectomy      29 yrs ago  . Total knee arthroplasty Bilateral [id number]  . Cervical laminectomy  2009  . Shoulder surgery Right 11  . Eye surgery Bilateral 09  . Transcatheter aortic valve replacement, transfemoral N/A 06/04/2014    Procedure: TRANSCATHETER AORTIC VALVE REPLACEMENT, TRANSFEMORAL;  Surgeon: [name], MD;  Location: [place];  Service: Open Heart Surgery;  Laterality: N/A;  . Intraoperative transesophageal echocardiogram N/A 06/04/2014    Procedure: INTRAOPERATIVE TRANSESOPHAGEAL ECHOCARDIOGRAM;  Surgeon: [name], MD;  Location: [facility] OR;  Service: Open Heart Surgery;  Laterality: N/A;  . Left and right  heart catheterization with coronary angiogram N/A 04/22/2014    Procedure: LEFT AND RIGHT HEART CATHETERIZATION WITH CORONARY ANGIOGRAM;  Surgeon: Blane Ohara, MD;  Location: [facility] CATH LAB;  Service: Cardiovascular;  Laterality: N/A;  . Skull surgery  2013    "plate on right side of head"    There were no vitals filed for this visit.      Subjective Assessment - 03/22/16 1028    Subjective Pt reports he had a bad night with his neck last night (pain up to 6-7/10) but much better this morning. Still wants to focus on his balance today.   Currently in Pain? Yes   Pain Score 2    Pain Location Neck           TODAY'S TREATMENT  TherEx NuStep - lvl 4 x 5' B Hamstring + gastroc stretch with strap 3x30' Bridge + Hip ABD isometric with black TB x15 Hooklying Alternating Hip ABD/ER with black TB x15 B Sidelying Hip ABD/ER clam with black TB x15 Seated  Chin tuck x10  UT stretch 2x30"  B Shoulder Rows with red TB 20x3"   B Shoulder ER with red TB 10x3"          PT Short Term Goals - 03/18/16 1852    PT SHORT TERM GOAL #1   Title Pt will be independent with initial cervical HEP by 03/23/16   Status On-going   PT  SHORT TERM GOAL #2   Title Pt will demonstrate awareness of appropriate placement and sequencing of cane with gait by 03/23/16   Status On-going           PT Long Term Goals - 03/18/16 1852    PT LONG TERM GOAL #1   Title Pt will be independent with advanced HEP as indicated by 04/30/16   Status On-going   PT LONG TERM GOAL #2   Title Pt will consistently demonstrate normal gait pattern with cane during gait by 04/30/16   Status On-going   PT LONG TERM GOAL #3   Title Pt will improve Berg Balance Scale score to >/= 28/56 to reduce risk for falls by 04/30/16   Status On-going   PT LONG TERM GOAL #4   Title Pt will report improved ability to turn head while driving by [id number]   Status On-going               Plan - 03/22/16 1203    Clinical  Impression Statement Pt reporting flare of neck pain last night but still wanting to focus on progression to balance activities. Reporting limited compliance with both cervical and core strengthening HEPs, noting cramps limiting bridging and L shoulder pain with shoulder horiz abduction, therefore focus of today's treatment was review of HEPs given pt's plans to travel with 3 week absence from PT starting next week. Will hopefully be able to introduce some basic standing balance activities/exercises for HEP as of next visit before pt leaves for extended absence.   PT Next Visit Plan Review HEP's and correct/modify pacing/technique as appropriate; Review gait training with cane; Balance/gait stability training   Consulted and Agree with Plan of Care Patient      Patient will benefit from skilled therapeutic intervention in order to improve the following deficits and impairments:  Decreased balance, Decreased coordination, Abnormal gait, Difficulty walking, Decreased strength, Decreased safety awareness, Decreased knowledge of use of DME, Decreased range of motion, Impaired flexibility, Pain, Postural dysfunction, Improper body mechanics  Visit Diagnosis: Other abnormalities of gait and mobility  Unsteadiness on feet  Cervicalgia     Problem List Patient Active Problem List   Diagnosis Date Noted  . Post concussion syndrome 07/23/2015  . Gait difficulty 07/23/2015  . Lumbar radiculopathy 07/23/2015  . Aortic valve disorders 06/04/2014  . 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 Severe aortic stenosis 04/24/2014  . Aortic stenosis, severe 04/04/2014  . Long-term (current) use of anticoagulants   . History of left foot drop 01/02/2014  . Leg length discrepancy 01/02/2014  . Mixed hyperlipidemia 09/27/2013  . Atrial fibrillation, permanent ([place])     Percival Spanish, PT, MPT 03/22/2016, 12:10 PM  [facility] [address]  [place], [place], [address] Phone: [phone]   Fax:  [phone]  Name: John [name] MRN: [id number] Date of Birth: 01/27/1926

## 2016-03-24 ENCOUNTER — Ambulatory Visit: Payer: Medicare HMO | Admitting: Physical Therapy

## 2016-03-24 DIAGNOSIS — M542 Cervicalgia: Secondary | ICD-10-CM | POA: Diagnosis not present

## 2016-03-24 DIAGNOSIS — R2681 Unsteadiness on feet: Secondary | ICD-10-CM | POA: Diagnosis not present

## 2016-03-24 DIAGNOSIS — R2689 Other abnormalities of gait and mobility: Secondary | ICD-10-CM | POA: Diagnosis not present

## 2016-03-24 NOTE — Therapy (Signed)
[facility] [address]  [place], [place], [address] Phone: [phone]   Fax:  [phone]  Physical Therapy Treatment  Patient Details  Name: John [name] MRN: [id number] Date of Birth: Apr 01, 1926 Referring Provider: Julian Reil, MD  Encounter Date: 03/24/2016      PT End of Session - 03/24/16 1023    Visit Number 6   Number of Visits 15   Date for PT Re-Evaluation 04/30/16   PT Start Time 1016   PT Stop Time 1101   PT Time Calculation (min) 45 min   Activity Tolerance Patient tolerated treatment well   Behavior During Therapy [facility] for tasks assessed/performed      Past Medical History  Diagnosis Date  . Aortic stenosis   . Mixed hyperlipidemia 09/27/2013    Does not want to take statins   . History of left foot drop 01/02/2014  . GERD (gastroesophageal reflux disease)   . Lumbar disc disease   . BPH (benign prostatic hyperplasia)   . Hypertension   . Dysrhythmia     afib  . Heart murmur   . History of kidney stones   . 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 Atrial fibrillation [facility])     Past Surgical History  Procedure Laterality Date  . Lumbar laminectomy      29 yrs ago  . Total knee arthroplasty Bilateral [id number]  . Cervical laminectomy  2009  . Shoulder surgery Right 11  . Eye surgery Bilateral 09  . Transcatheter aortic valve replacement, transfemoral N/A 06/04/2014    Procedure: TRANSCATHETER AORTIC VALVE REPLACEMENT, TRANSFEMORAL;  Surgeon: [name], MD;  Location: [place];  Service: Open Heart Surgery;  Laterality: N/A;  . Intraoperative transesophageal echocardiogram N/A 06/04/2014    Procedure: INTRAOPERATIVE TRANSESOPHAGEAL ECHOCARDIOGRAM;  Surgeon: [name], MD;  Location: [facility] OR;  Service: Open Heart Surgery;  Laterality: N/A;  . Left and right  heart catheterization with coronary angiogram N/A 04/22/2014    Procedure: LEFT AND RIGHT HEART CATHETERIZATION WITH CORONARY ANGIOGRAM;  Surgeon: Blane Ohara, MD;  Location: [facility] CATH LAB;  Service: Cardiovascular;  Laterality: N/A;  . Skull surgery  2013    "plate on right side of head"    There were no vitals filed for this visit.      Subjective Assessment - 03/24/16 1020    Subjective "Everything's pretty good right now."   Currently in Pain? Yes   Pain Score 2    Pain Location Neck          TODAY'S TREATMENT  TherEx NuStep - lvl 4 x 5' Hooklying   Chest/pec stretch x1'  B Shoulder Horiz ABD with red TB 15x3"  B Shoulder ER with red TB 15x3" Seated  Chin tuck x10  UT stretch 2x30" Standing at back of chair:   SLS with intermittent UE support 5x5"   High knee Marching with single UE support x10    Hip ABD x10   Hip Extension x10   Lateral weight shift to SLS 10x3"   Shallow lunge with single UE support x10         PT Education - 03/24/16 1221    Education provided Yes   Education Details Preliminary standing/balance HEP   Person(s) Educated Patient   Methods Explanation;Demonstration;Handout;Verbal cues;Tactile cues   Comprehension Verbalized understanding;Returned demonstration;Verbal cues required;Tactile cues  required;Need further instruction          PT Short Term Goals - 03/24/16 1101    PT SHORT TERM GOAL #1   Title Pt will be independent with initial cervical HEP by 03/23/16   Status Achieved   PT SHORT TERM GOAL #2   Title Pt will demonstrate awareness of appropriate placement and sequencing of cane with gait by 03/23/16   Status Achieved  Pt aware of proper technique for use of cane but prefers to use it "his way".           PT Long Term Goals - 03/24/16 1101    PT LONG TERM GOAL #1   Title Pt will be independent with advanced HEP as indicated by 04/30/16   Status On-going   PT LONG TERM GOAL #2   Title Pt will consistently  demonstrate normal gait pattern with cane during gait by 04/30/16   Status On-going   PT LONG TERM GOAL #3   Title Pt will improve Berg Balance Scale score to >/= 28/56 to reduce risk for falls by 04/30/16   Status On-going   PT LONG TERM GOAL #4   Title Pt will report improved ability to turn head while driving by [id number]   Status On-going               Plan - 03/24/16 1101    Clinical Impression Statement Pt requesting further review of cervical HEP before progressing to creation of preliminary standing/balance HEP in preparation for extended vacation beginning next week. Pt able to perform cervical HEP correctly after review, but repeated instruction necessary during standing exercises with need for use of UE support during exercises emphasized throughout all exercises to reduce risk for falls. Pt to continue with cervical, core strengthening and standing HEPs while on vacation, and upon return will plan to progress standing program as appropriate.   PT Next Visit Plan Balance/gait stability training   Consulted and Agree with Plan of Care Patient      Patient will benefit from skilled therapeutic intervention in order to improve the following deficits and impairments:  Decreased balance, Decreased coordination, Abnormal gait, Difficulty walking, Decreased strength, Decreased safety awareness, Decreased knowledge of use of DME, Decreased range of motion, Impaired flexibility, Pain, Postural dysfunction, Improper body mechanics  Visit Diagnosis: Other abnormalities of gait and mobility  Unsteadiness on feet  Cervicalgia     Problem List Patient Active Problem List   Diagnosis Date Noted  . Post concussion syndrome 07/23/2015  . Gait difficulty 07/23/2015  . Lumbar radiculopathy 07/23/2015  . Aortic valve disorders 06/04/2014  . 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 Severe aortic stenosis 04/24/2014  . Aortic stenosis, severe 04/04/2014  .  Long-term (current) use of anticoagulants   . History of left foot drop 01/02/2014  . Leg length discrepancy 01/02/2014  . Mixed hyperlipidemia 09/27/2013  . Atrial fibrillation, permanent ([place])     Percival Spanish, PT, MPT 03/24/2016, 12:35 PM  [facility] [address]  [place], [place], [address] Phone: [phone]   Fax:  [phone]  Name: Britan Eigner MRN: [id number] Date of Birth: 08-03-1926

## 2016-03-29 ENCOUNTER — Ambulatory Visit: Payer: Medicare HMO | Admitting: Physical Therapy

## 2016-04-01 ENCOUNTER — Ambulatory Visit: Payer: Medicare HMO | Admitting: Physical Therapy

## 2016-04-05 ENCOUNTER — Ambulatory Visit: Payer: Medicare HMO | Admitting: Physical Therapy

## 2016-04-08 ENCOUNTER — Ambulatory Visit: Payer: Medicare HMO | Admitting: Physical Therapy

## 2016-04-09 DIAGNOSIS — R05 Cough: Secondary | ICD-10-CM | POA: Diagnosis not present

## 2016-04-12 ENCOUNTER — Ambulatory Visit: Payer: Medicare HMO | Admitting: Physical Therapy

## 2016-04-15 ENCOUNTER — Ambulatory Visit: Payer: Medicare HMO | Admitting: Physical Therapy

## 2016-04-21 ENCOUNTER — Ambulatory Visit: Payer: Medicare HMO | Attending: Unknown Physician Specialty | Admitting: Physical Therapy

## 2016-04-21 DIAGNOSIS — R2681 Unsteadiness on feet: Secondary | ICD-10-CM | POA: Diagnosis not present

## 2016-04-21 DIAGNOSIS — R2689 Other abnormalities of gait and mobility: Secondary | ICD-10-CM | POA: Diagnosis not present

## 2016-04-21 DIAGNOSIS — M542 Cervicalgia: Secondary | ICD-10-CM | POA: Diagnosis not present

## 2016-04-21 NOTE — Therapy (Signed)
[facility] [address]  [place], [place], [address] Phone: [phone]   Fax:  [phone]  Physical Therapy Treatment  Patient Details  Name: John [name] MRN: [id number] Date of Birth: [birth year] Referring Provider: Julian Reil, MD  Encounter Date: 04/21/2016      PT End of Session - 04/21/16 1018    Visit Number 7   Number of Visits 15   Date for PT Re-Evaluation 04/30/16   PT Start Time [id number]   PT Stop Time 1104   PT Time Calculation (min) 46 min   Activity Tolerance Patient tolerated treatment well   Behavior During Therapy [facility] for tasks assessed/performed      Past Medical History  Diagnosis Date  . Aortic stenosis   . Mixed hyperlipidemia 09/27/2013    Does not want to take statins   . History of left foot drop 01/02/2014  . GERD (gastroesophageal reflux disease)   . Lumbar disc disease   . BPH (benign prostatic hyperplasia)   . Hypertension   . Dysrhythmia     afib  . Heart murmur   . History of kidney stones   . 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 Atrial fibrillation [facility])     Past Surgical History  Procedure Laterality Date  . Lumbar laminectomy      29 yrs ago  . Total knee arthroplasty Bilateral [id number]  . Cervical laminectomy  2009  . Shoulder surgery Right 11  . Eye surgery Bilateral 09  . Transcatheter aortic valve replacement, transfemoral N/A 06/04/2014    Procedure: TRANSCATHETER AORTIC VALVE REPLACEMENT, TRANSFEMORAL;  Surgeon: [name], MD;  Location: [place];  Service: Open Heart Surgery;  Laterality: N/A;  . Intraoperative transesophageal echocardiogram N/A 06/04/2014    Procedure: INTRAOPERATIVE TRANSESOPHAGEAL ECHOCARDIOGRAM;  Surgeon: [name], MD;  Location: [facility] OR;  Service: Open Heart Surgery;  Laterality: N/A;  . Left and right  heart catheterization with coronary angiogram N/A 04/22/2014    Procedure: LEFT AND RIGHT HEART CATHETERIZATION WITH CORONARY ANGIOGRAM;  Surgeon: Blane Ohara, MD;  Location: [facility] CATH LAB;  Service: Cardiovascular;  Laterality: N/A;  . Skull surgery  2013    "plate on right side of head"    There were no vitals filed for this visit.      Subjective Assessment - 04/21/16 1024    Subjective Pt reports poor compliance with HEP while on vacation for the last month due to "no motiviation" and having pneumonia. Pt denies pain at present but still reports pain up to 5/10 at times in R side of neck.   Patient Stated Goals Improve walking by 50%   Currently in Pain? No/denies          TODAY'S TREATMENT  TherEx NuStep - lvl 7 x 5' Seated:  Chin tuck x10  UT stretch 2x30" Hooklying:   Chest/pec stretch x1'  B Shoulder Horiz ABD with red TB 15x3" (modified on L d/t shoulder discomfort from preexisting RTC injury)  B Shoulder ER with red TB 15x3" Standing at back of chair:  B SLS with intermittent UE support 5x10"  High knee Marching with single UE support x10   Alternating Hip ABD x10, with yellow TB x5  Alternating Hip Extension x10, with yellow TB x5  Lateral weight shift to SLS 10x3"  Shallow lunge with single UE support  x10          PT Short Term Goals - 03/24/16 1101    PT SHORT TERM GOAL #1   Title Pt will be independent with initial cervical HEP by 03/23/16   Status Achieved   PT SHORT TERM GOAL #2   Title Pt will demonstrate awareness of appropriate placement and sequencing of cane with gait by 03/23/16   Status Achieved  Pt aware of proper technique for use of cane but prefers to use it "his way".           PT Long Term Goals - 04/21/16 1101    PT LONG TERM GOAL #1   Title Pt will be independent with advanced HEP as indicated by 04/30/16   Status On-going   PT LONG TERM GOAL #2   Title Pt will consistently demonstrate normal gait pattern with  cane during gait by 04/30/16   Status On-going   PT LONG TERM GOAL #3   Title Pt will improve Berg Balance Scale score to >/= 28/56 to reduce risk for falls by 04/30/16   Status On-going   PT LONG TERM GOAL #4   Title Pt will report improved ability to turn head while driving by [id number]   Status On-going               Plan - 04/21/16 1047    Clinical Impression Statement Pt returning to PT after 4 week vacation, reporting poor compliance with HEP while on vacation due to "no motivation" and having pneumonia. As such, pt required review of HEP before proceeding with progression of balance and gait activities as planned. Pt educated on importance of compliance with HEP at least 4x/wk  to build strength and basic components of balance porgram.   PT Next Visit Plan Balance/gait stability training   Consulted and Agree with Plan of Care Patient      Patient will benefit from skilled therapeutic intervention in order to improve the following deficits and impairments:  Decreased balance, Decreased coordination, Abnormal gait, Difficulty walking, Decreased strength, Decreased safety awareness, Decreased knowledge of use of DME, Decreased range of motion, Impaired flexibility, Pain, Postural dysfunction, Improper body mechanics  Visit Diagnosis: Other abnormalities of gait and mobility  Unsteadiness on feet  Cervicalgia     Problem List Patient Active Problem List   Diagnosis Date Noted  . Post concussion syndrome 07/23/2015  . Gait difficulty 07/23/2015  . Lumbar radiculopathy 07/23/2015  . Aortic valve disorders 06/04/2014  . 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 Severe aortic stenosis 04/24/2014  . Aortic stenosis, severe 04/04/2014  . Long-term (current) use of anticoagulants   . History of left foot drop 01/02/2014  . Leg length discrepancy 01/02/2014  . Mixed hyperlipidemia 09/27/2013  . Atrial fibrillation, permanent ([place])     Percival Spanish, PT, MPT 04/21/2016, 12:25 PM  [facility] [address]  [place], [place], [address] Phone: [phone]   Fax:  [phone]  Name: John [name] MRN: [id number] Date of Birth: Mar 28, 1926

## 2016-04-23 ENCOUNTER — Ambulatory Visit: Payer: Medicare HMO | Admitting: Physical Therapy

## 2016-04-23 DIAGNOSIS — R2689 Other abnormalities of gait and mobility: Secondary | ICD-10-CM

## 2016-04-23 DIAGNOSIS — R2681 Unsteadiness on feet: Secondary | ICD-10-CM

## 2016-04-23 DIAGNOSIS — M542 Cervicalgia: Secondary | ICD-10-CM

## 2016-04-23 NOTE — Therapy (Signed)
[facility] [address]  [place], [place], [address] Phone: [phone]   Fax:  [phone]  Physical Therapy Treatment  Patient Details  Name: John [name] MRN: [id number] Date of Birth: [birth year] Referring Provider: Julian Reil, MD  Encounter Date: 04/23/2016      PT End of Session - 04/23/16 1020    Visit Number 8   Number of Visits 15   Date for PT Re-Evaluation 04/30/16   PT Start Time 1020   PT Stop Time 1103   PT Time Calculation (min) 43 min   Activity Tolerance Patient tolerated treatment well;Patient limited by fatigue   Behavior During Therapy [facility] for tasks assessed/performed;Impulsive      Past Medical History  Diagnosis Date  . Aortic stenosis   . Mixed hyperlipidemia 09/27/2013    Does not want to take statins   . History of left foot drop 01/02/2014  . GERD (gastroesophageal reflux disease)   . Lumbar disc disease   . BPH (benign prostatic hyperplasia)   . Hypertension   . Dysrhythmia     afib  . Heart murmur   . History of kidney stones   . 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 Atrial fibrillation [facility])     Past Surgical History  Procedure Laterality Date  . Lumbar laminectomy      29 yrs ago  . Total knee arthroplasty Bilateral [id number]  . Cervical laminectomy  2009  . Shoulder surgery Right 11  . Eye surgery Bilateral 09  . Transcatheter aortic valve replacement, transfemoral N/A 06/04/2014    Procedure: TRANSCATHETER AORTIC VALVE REPLACEMENT, TRANSFEMORAL;  Surgeon: [name], MD;  Location: [place];  Service: Open Heart Surgery;  Laterality: N/A;  . Intraoperative transesophageal echocardiogram N/A 06/04/2014    Procedure: INTRAOPERATIVE TRANSESOPHAGEAL ECHOCARDIOGRAM;  Surgeon: [name], MD;  Location: [facility] OR;  Service: Open Heart Surgery;   Laterality: N/A;  . Left and right heart catheterization with coronary angiogram N/A 04/22/2014    Procedure: LEFT AND RIGHT HEART CATHETERIZATION WITH CORONARY ANGIOGRAM;  Surgeon: Blane Ohara, MD;  Location: [facility] CATH LAB;  Service: Cardiovascular;  Laterality: N/A;  . Skull surgery  2013    "plate on right side of head"    There were no vitals filed for this visit.      Subjective Assessment - 04/23/16 1024    Subjective Pt notes neck is bothering him a little this morning, but still would like to focus on balance today.   Currently in Pain? Yes   Pain Score --  2-3/10   Pain Location Neck   Pain Orientation Right;Lateral           TODAY'S TREATMENT  TherEx NuStep - lvl 7 x 6'  Neuro Alternating Fwd Lunge over 1/2 foam (great difficulty with eccentric control when stepping back to starting position requiring CGA & occasional min A to correct LOB) Stepping backward with large steps with single HHA along counter top x4 B Sidestepping in slight squat along counter top x2 each Retro step with TRX UE support to facilitate balance and weight shift (CGA required due to frequent LOB) Facing wall Alternating lateral & low diagonal reaching 8x each (initially very difficult but was able to perform with much larger reach by last few reps) close SBA/CGA  Gait Review of proper placement and sequencing of  cane with gait in hallway 2 x 250 ft          PT Short Term Goals - 03/24/16 1101    PT SHORT TERM GOAL #1   Title Pt will be independent with initial cervical HEP by 03/23/16   Status Achieved   PT SHORT TERM GOAL #2   Title Pt will demonstrate awareness of appropriate placement and sequencing of cane with gait by 03/23/16   Status Achieved  Pt aware of proper technique for use of cane but prefers to use it "his way".           PT Long Term Goals - 04/21/16 1101    PT LONG TERM GOAL #1   Title Pt will be independent with advanced HEP as indicated by 04/30/16   Status  On-going   PT LONG TERM GOAL #2   Title Pt will consistently demonstrate normal gait pattern with cane during gait by 04/30/16   Status On-going   PT LONG TERM GOAL #3   Title Pt will improve Berg Balance Scale score to >/= 28/56 to reduce risk for falls by 04/30/16   Status On-going   PT LONG TERM GOAL #4   Title Pt will report improved ability to turn head while driving by [id number]   Status On-going               Plan - 04/23/16 1103    Clinical Impression Statement Pt noting increased neck pain today but still wanting to focus balance issues. Targeted lateral and posterior weight shifting and stepping as pt demonstrating increased tendency for LOB in these directions, with LOB most common when attempting to step backwards. Pt fatigued quickly when challenged for balance, requiring frequent seated rest breaks.   PT Next Visit Plan Balance/gait stability training   Consulted and Agree with Plan of Care Patient      Patient will benefit from skilled therapeutic intervention in order to improve the following deficits and impairments:  Decreased balance, Decreased coordination, Abnormal gait, Difficulty walking, Decreased strength, Decreased safety awareness, Decreased knowledge of use of DME, Decreased range of motion, Impaired flexibility, Pain, Postural dysfunction, Improper body mechanics  Visit Diagnosis: Other abnormalities of gait and mobility  Unsteadiness on feet  Cervicalgia     Problem List Patient Active Problem List   Diagnosis Date Noted  . Post concussion syndrome 07/23/2015  . Gait difficulty 07/23/2015  . Lumbar radiculopathy 07/23/2015  . Aortic valve disorders 06/04/2014  . 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 Severe aortic stenosis 04/24/2014  . Aortic stenosis, severe 04/04/2014  . Long-term (current) use of anticoagulants   . History of left foot drop 01/02/2014  . Leg length discrepancy 01/02/2014  . Mixed hyperlipidemia  09/27/2013  . Atrial fibrillation, permanent ([place])     Percival Spanish, PT, MPT 04/23/2016, 12:42 PM  [facility] [address]  [place], [place], [address] Phone: [phone]   Fax:  [phone]  Name: John [name] MRN: [id number] Date of Birth: 1926/10/03

## 2016-04-26 ENCOUNTER — Ambulatory Visit: Payer: Medicare HMO | Admitting: Physical Therapy

## 2016-04-26 DIAGNOSIS — D3132 Benign neoplasm of left choroid: Secondary | ICD-10-CM | POA: Diagnosis not present

## 2016-04-26 DIAGNOSIS — H43813 Vitreous degeneration, bilateral: Secondary | ICD-10-CM | POA: Diagnosis not present

## 2016-04-26 DIAGNOSIS — M542 Cervicalgia: Secondary | ICD-10-CM

## 2016-04-26 DIAGNOSIS — R2689 Other abnormalities of gait and mobility: Secondary | ICD-10-CM | POA: Diagnosis not present

## 2016-04-26 DIAGNOSIS — H353211 Exudative age-related macular degeneration, right eye, with active choroidal neovascularization: Secondary | ICD-10-CM | POA: Diagnosis not present

## 2016-04-26 DIAGNOSIS — H353122 Nonexudative age-related macular degeneration, left eye, intermediate dry stage: Secondary | ICD-10-CM | POA: Diagnosis not present

## 2016-04-26 DIAGNOSIS — R2681 Unsteadiness on feet: Secondary | ICD-10-CM

## 2016-04-26 NOTE — Therapy (Signed)
[facility] [place] [address]  [place], [place], [address] Phone: [phone]   Fax:  [phone]  Physical Therapy Treatment  Patient Details  Name: John [name] MRN: [id number] Date of Birth: [birth year] Referring Provider: Julian Reil, MD  Encounter Date: 04/26/2016      PT End of Session - 04/26/16 1029    Visit Number 9   Number of Visits 15   Date for PT Re-Evaluation 04/30/16   PT Start Time [id number]  Pt arrived late   PT Stop Time 1104   PT Time Calculation (min) 35 min   Activity Tolerance Patient tolerated treatment well   Behavior During Therapy [facility] for tasks assessed/performed;Impulsive      Past Medical History  Diagnosis Date  . Aortic stenosis   . Mixed hyperlipidemia 09/27/2013    Does not want to take statins   . History of left foot drop 01/02/2014  . GERD (gastroesophageal reflux disease)   . Lumbar disc disease   . BPH (benign prostatic hyperplasia)   . Hypertension   . Dysrhythmia     afib  . Heart murmur   . History of kidney stones   . 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 Atrial fibrillation [facility])     Past Surgical History  Procedure Laterality Date  . Lumbar laminectomy      29 yrs ago  . Total knee arthroplasty Bilateral [id number]  . Cervical laminectomy  2009  . Shoulder surgery Right 11  . Eye surgery Bilateral 09  . Transcatheter aortic valve replacement, transfemoral N/A 06/04/2014    Procedure: TRANSCATHETER AORTIC VALVE REPLACEMENT, TRANSFEMORAL;  Surgeon: [name], MD;  Location: [facility];  Service: Open Heart Surgery;  Laterality: N/A;  . Intraoperative transesophageal echocardiogram N/A 06/04/2014    Procedure: INTRAOPERATIVE TRANSESOPHAGEAL ECHOCARDIOGRAM;  Surgeon: [name], MD;  Location: [facility] OR;  Service: Open Heart Surgery;   Laterality: N/A;  . Left and right heart catheterization with coronary angiogram N/A 04/22/2014    Procedure: LEFT AND RIGHT HEART CATHETERIZATION WITH CORONARY ANGIOGRAM;  Surgeon: Blane Ohara, MD;  Location[facility] C[facility] LAB;  Service: Cardiovascular;  Laterality: N/A;  . Skull surgery  2013    "plate on right side of head"    There were no vitals filed for this visit.      Subjective Assessment - 04/26/16 1040    Patient Stated Goals Improve walking by 50%   Currently in Pain? Yes   Pain Score 1    Pain Location Neck   Pain Orientation Right;Lateral            [facility] PT Assessment - 04/26/16 1029    Assessment   Medical Diagnosis Neck pain/Cervical radiculopathy/Gait difficulty   Onset Date/Surgical Date --  August 2016   Strength   Strength Assessment Site Hip;Knee;Ankle   Right Hip Flexion 4+/5   Right Hip Extension 4-/5   Right Hip ABduction 4+/5   Right Hip ADduction 4+/5   Left Hip Flexion 4/5   Left Hip Extension 4-/5   Left Hip ABduction 4/5   Left Hip ADduction 4+/5   Right Knee Flexion 5/5   Right Knee Extension 5/5   Left Knee Flexion 4+/5   Left Knee Extension 4+/5   Standardized Balance Assessment   Standardized Balance Assessment Berg Balance Test   Berg Balance Test  Sit to Stand Able to stand without using hands and stabilize independently   Standing Unsupported Able to stand 30 seconds unsupported  ~60 sec before LOB requiring return to sitting   Sitting with Back Unsupported but Feet Supported on Floor or Stool Able to sit safely and securely 2 minutes   Stand to Sit Sits safely with minimal use of hands   Transfers Able to transfer safely, definite need of hands   Standing Unsupported with Eyes Closed Needs help to keep from falling   Standing Ubsupported with Feet Together Needs help to attain position but able to stand for 30 seconds with feet together   From Standing, Reach Forward with Outstretched Arm Reaches forward but needs supervision    From Standing Position, Pick up Object from Floor Unable to pick up and needs supervision   From Standing Position, Turn to Look Behind Over each Shoulder Needs assist to keep from losing balance and falling   Turn 360 Degrees Needs assistance while turning   Standing Unsupported, Alternately Place Feet on Step/Stool Needs assistance to keep from falling or unable to try   Standing Unsupported, One Foot in Front Needs help to step but can hold 15 seconds   Standing on One Leg Tries to lift leg/unable to hold 3 seconds but remains standing independently   Total Score 22          Today's Treatment  Review of HEP at pt's request (pt wanting to reduce HEP stating too many exercises). - Explanation of rationale for each exercise provided, but when certain exercises attempted to be removed from HEP, pt resisted, therefore compromise achieved with pt to complete 1 HEP handout per day, alternating days.  PPT MMT Berg Balance Test           PT Short Term Goals - 03/24/16 1101    PT SHORT TERM GOAL #1   Title Pt will be independent with initial cervical HEP by 03/23/16   Status Achieved   PT SHORT TERM GOAL #2   Title Pt will demonstrate awareness of appropriate placement and sequencing of cane with gait by 03/23/16   Status Achieved  Pt aware of proper technique for use of cane but prefers to use it "his way".           PT Long Term Goals - 04/21/16 1101    PT LONG TERM GOAL #1   Title Pt will be independent with advanced HEP as indicated by 04/30/16   Status On-going   PT LONG TERM GOAL #2   Title Pt will consistently demonstrate normal gait pattern with cane during gait by 04/30/16   Status On-going   PT LONG TERM GOAL #3   Title Pt will improve Berg Balance Scale score to >/= 28/56 to reduce risk for falls by 04/30/16   Status On-going   PT LONG TERM GOAL #4   Title Pt will report improved ability to turn head while driving by [id number]   Status On-going                Plan - 04/26/16 1104    Clinical Impression Statement Pt arrived almost 15 minutes late for session and then wanted to discuss streamlining HEP as he felt like there are too many exercises. Compromise acheived with pt to complete each set of exercises on alternating days. Given delayed start due to late arrivial and time spent addressing HEP, remaining visit time directed to reassessment of balance per Merrilee Jansky balance scale with  pt demonstrating a 7 point improvement from 15/56 to 22/56. Pt continues to note dizziness at times which he denies being associated with positional changes, but rather thinks is related to his eyes/vision; may consider formal vestibular assessment if this persists.   PT Next Visit Plan Balance/gait stability training   Consulted and Agree with Plan of Care Patient      Patient will benefit from skilled therapeutic intervention in order to improve the following deficits and impairments:  Decreased balance, Decreased coordination, Abnormal gait, Difficulty walking, Decreased strength, Decreased safety awareness, Decreased knowledge of use of DME, Decreased range of motion, Impaired flexibility, Pain, Postural dysfunction, Improper body mechanics  Visit Diagnosis: Other abnormalities of gait and mobility  Unsteadiness on feet  Cervicalgia     Problem List Patient Active Problem List   Diagnosis Date Noted  . Post concussion syndrome 07/23/2015  . Gait difficulty 07/23/2015  . Lumbar radiculopathy 07/23/2015  . Aortic valve disorders 06/04/2014  . 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 Severe aortic stenosis 04/24/2014  . Aortic stenosis, severe 04/04/2014  . Long-term (current) use of anticoagulants   . History of left foot drop 01/02/2014  . Leg length discrepancy 01/02/2014  . Mixed hyperlipidemia 09/27/2013  . Atrial fibrillation, permanent ([place])     Percival Spanish, PT, MPT 04/26/2016, 12:14 PM  [facility] [address]  [place], [place], [address] Phone: [phone]   Fax:  [phone]  Name: Homer Christopher MRN: [id number] Date of Birth: 1926-05-14

## 2016-04-29 ENCOUNTER — Ambulatory Visit: Payer: Medicare HMO | Admitting: Physical Therapy

## 2016-04-29 DIAGNOSIS — R2681 Unsteadiness on feet: Secondary | ICD-10-CM

## 2016-04-29 DIAGNOSIS — R2689 Other abnormalities of gait and mobility: Secondary | ICD-10-CM | POA: Diagnosis not present

## 2016-04-29 NOTE — Therapy (Signed)
[facility] [address]  [address] [place], [place], [address] Phone: [phone]   Fax:  [phone]  Physical Therapy Treatment  Patient Details  Name: John [name] MRN: [id number] Date of Birth: 11/17/1925 Referring Provider: Joseph Keith Miller, MD  Encounter Date: 04/29/2016      PT End of Session - 04/29/16 1455    Visit Number 10   Number of Visits 16   Date for PT Re-Evaluation 05/21/16   PT Start Time 1455  Pt on phone call   PT Stop Time 1538   PT Time Calculation (min) 43 min   Activity Tolerance Patient tolerated treatment well   Behavior During Therapy WFL for tasks assessed/performed;Impulsive      Past Medical History  Diagnosis Date  . Aortic stenosis   . Mixed hyperlipidemia 09/27/2013    Does not want to take statins   . History of left foot drop 01/02/2014  . GERD (gastroesophageal reflux disease)   . Lumbar disc disease   . BPH (benign prostatic hyperplasia)   . Hypertension   . Dysrhythmia     afib  . Heart murmur   . History of kidney stones   . 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 Atrial fibrillation ([facility])     Past Surgical History  Procedure Laterality Date  . Lumbar laminectomy      29 yrs ago  . Total knee arthroplasty Bilateral 2008,2009  . Cervical laminectomy  2009  . Shoulder surgery Right 11  . Eye surgery Bilateral 09  . Transcatheter aortic valve replacement, transfemoral N/A 06/04/2014    Procedure: TRANSCATHETER AORTIC VALVE REPLACEMENT, TRANSFEMORAL;  Surgeon: Michael Cooper, MD;  Location: [facility] OR;  Service: Open Heart Surgery;  Laterality: N/A;  . Intraoperative transesophageal echocardiogram N/A 06/04/2014    Procedure: INTRAOPERATIVE TRANSESOPHAGEAL ECHOCARDIOGRAM;  Surgeon: Michael Cooper, MD;  Location: [facility] OR;  Service: Open Heart Surgery;   Laterality: N/A;  . Left and right heart catheterization with coronary angiogram N/A 04/22/2014    Procedure: LEFT AND RIGHT HEART CATHETERIZATION WITH CORONARY ANGIOGRAM;  Surgeon: Michael D Cooper, MD;  Location: [facility] CATH LAB;  Service: Cardiovascular;  Laterality: N/A;  . Skull surgery  2013    "plate on right side of head"    There were no vitals filed for this visit.      Subjective Assessment - 04/29/16 1459    Subjective Pt noting some discomfort in L side from working on standing exercises but denies pain.   Patient Stated Goals Improve walking by 50%   Currently in Pain? Yes   Pain Score --  1.5/10   Pain Location Neck   Pain Orientation Right;Lateral            [facility] PT Assessment - 04/29/16 1455    Assessment   Medical Diagnosis Neck pain/Cervical radiculopathy/Gait difficulty   Referring Provider Joseph Keith Miller, MD   Onset Date/Surgical Date --  August 2016   Next MD Visit 05/27/16   Observation/Other Assessments   Focus on Therapeutic Outcomes (FOTO)  63% (37% limitation)   AROM   AROM Assessment Site Cervical;Shoulder   Right/Left Shoulder Right;Left   Right Shoulder Flexion 133 Degrees   Right Shoulder ABduction 150 Degrees   Left Shoulder Flexion 90 Degrees   Cervical Flexion 40   Cervical Extension 46   Cervical - Right Side   Bend 29   Cervical - Left Side Bend 28   Cervical - Right Rotation 29   Cervical - Left Rotation 50   Strength   Strength Assessment Site Shoulder;Hip;Knee   Right/Left Shoulder Right   Right Shoulder Flexion 4+/5   Right Shoulder ABduction 4+/5   Right Shoulder Internal Rotation 4+/5   Right Shoulder External Rotation 4+/5   Right/Left Hip Right;Left   Right Hip Flexion 4+/5   Right Hip Extension 4-/5   Right Hip ABduction 4+/5   Right Hip ADduction 4+/5   Left Hip Flexion 4/5   Left Hip Extension 4-/5   Left Hip ABduction 4/5   Left Hip ADduction 4+/5   Right/Left Knee Right;Left   Right Knee Flexion 5/5   Right  Knee Extension 5/5   Left Knee Flexion 4+/5   Left Knee Extension 4+/5   Standardized Balance Assessment   Standardized Balance Assessment Berg Balance Test   Berg Balance Test   Sit to Stand Able to stand without using hands and stabilize independently   Standing Unsupported Able to stand 30 seconds unsupported  ~60 sec before LOB requiring return to sitting   Sitting with Back Unsupported but Feet Supported on Floor or Stool Able to sit safely and securely 2 minutes   Stand to Sit Sits safely with minimal use of hands   Transfers Able to transfer safely, definite need of hands   Standing Unsupported with Eyes Closed Needs help to keep from falling   Standing Ubsupported with Feet Together Needs help to attain position but able to stand for 30 seconds with feet together   From Standing, Reach Forward with Outstretched Arm Reaches forward but needs supervision   From Standing Position, Pick up Object from Floor Unable to pick up and needs supervision   From Standing Position, Turn to Look Behind Over each Shoulder Needs assist to keep from losing balance and falling   Turn 360 Degrees Needs assistance while turning   Standing Unsupported, Alternately Place Feet on Step/Stool Needs assistance to keep from falling or unable to try   Standing Unsupported, One Foot in Front Needs help to step but can hold 15 seconds   Standing on One Leg Tries to lift leg/unable to hold 3 seconds but remains standing independently   Total Score 22          TODAY'S TREATMENT  Recert  Cervical ROM check   Goal Assessment  Neuro Static standing focusing on shifting weight to toes due to retropulsion on heels - up to mod A to prevent posterior LOB, but improved when pt able to use 2 pole A Heel-toe weight shift on blue foam Airex - repeated cues/assist to prevent posterior LOB Standing on blue foam Airex with alternating step-touch to 6" step - min/mod A to maintain upright posture to prevent posterior  LOB          PT Short Term Goals - 03/24/16 1101    PT SHORT TERM GOAL #1   Title Pt will be independent with initial cervical HEP by 03/23/16   Status Achieved   PT SHORT TERM GOAL #2   Title Pt will demonstrate awareness of appropriate placement and sequencing of cane with gait by 03/23/16   Status Achieved  Pt aware of proper technique for use of cane but prefers to use it "his way".           PT Long Term Goals - 04/29/16 1513    PT LONG TERM GOAL #  1   Title Pt will be independent with advanced HEP as indicated by 05/21/16   Status On-going   PT LONG TERM GOAL #2   Title Pt will consistently demonstrate normal gait pattern with cane during gait by 04/30/16   Status Achieved   PT LONG TERM GOAL #3   Title Pt will improve Berg Balance Scale score to >/= 28/56 to reduce risk for falls by 05/21/16   Status On-going  Currently 22/56   PT LONG TERM GOAL #4   Title Pt will report improved ability to turn head while driving by 04/30/16   Status Achieved  Pt is able to turn head adequately to check blind spot using mirrors.               Plan - 04/29/16 1917    Clinical Impression Statement Pt has demonstrated good progress with PT so far, with improvement noted in cervical ROM with pain less intense and more intermittent at present. LE strength improving but weakness persists L > R with greatest limitation in B hip extension which continues to contribute to postural and balance deficits. Gait pattern has improved with pt demonstrating more consistent proper use of cane. Balance has also improved as evidenced by improved score on Berg from 17/56 to 22/56. 2 of 4 LTG's met. Pt continues to have potential to benefit from skilled PT for further balance and gait safety training, but has reached the end of the current cert period dates although 5 visits remain from initial POC as pt took an extended vacation during the cert period. Will plan to extend POC for addtional 2x/wk for 3 weeks  to utilize these visits to continue focus on balance and gait stability.   Rehab Potential Good   Clinical Impairments Affecting Rehab Potential Lumbar DDD with radiculopathy & L foot drop, chronic afib, severe aortic stenosis   PT Frequency 2x / week   PT Duration 3 weeks   PT Treatment/Interventions Patient/family education;Gait training;DME Instruction;Neuromuscular re-education;Balance training;Therapeutic activities;Functional mobility training;Therapeutic exercise;Manual techniques;Passive range of motion;Ultrasound;Electrical Stimulation;Moist Heat;Cryotherapy;Traction   PT Next Visit Plan Balance/gait stability training   Consulted and Agree with Plan of Care Patient      Patient will benefit from skilled therapeutic intervention in order to improve the following deficits and impairments:  Decreased balance, Decreased coordination, Abnormal gait, Difficulty walking, Decreased strength, Decreased safety awareness, Decreased knowledge of use of DME, Decreased range of motion, Impaired flexibility, Pain, Postural dysfunction, Improper body mechanics  Visit Diagnosis: Other abnormalities of gait and mobility  Unsteadiness on feet       G-Codes - 04/29/16 1535    Functional Assessment Tool Used Berg = 22/56 (60.7% Impaired) + clinical judgement   Functional Limitation Mobility: Walking and moving around   Mobility: Walking and Moving Around Current Status (G8978) At least 60 percent but less than 80 percent impaired, limited or restricted   Mobility: Walking and Moving Around Goal Status (G8979) At least 40 percent but less than 60 percent impaired, limited or restricted      Problem List Patient Active Problem List   Diagnosis Date Noted  . Post concussion syndrome 07/23/2015  . Gait difficulty 07/23/2015  . Lumbar radiculopathy 07/23/2015  . Aortic valve disorders 06/04/2014  . 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 Severe aortic stenosis 04/24/2014   . Aortic stenosis, severe 04/04/2014  . Long-term (current) use of anticoagulants   . History of left foot drop 01/02/2014  . Leg length discrepancy 01/02/2014  .   Mixed hyperlipidemia 09/27/2013  . Atrial fibrillation, permanent ([facility])     JoAnne M Kreis, PT, MPT 04/29/2016, 7:44 PM  [facility] [address]  [address] [place], [place], [address] Phone: [phone]   Fax:  [phone]  Name: John [name] MRN: [id number] Date of Birth: 02/08/1926    

## 2016-05-03 ENCOUNTER — Ambulatory Visit: Payer: Medicare HMO | Admitting: Physical Therapy

## 2016-05-03 DIAGNOSIS — R2681 Unsteadiness on feet: Secondary | ICD-10-CM

## 2016-05-03 DIAGNOSIS — Z79899 Other long term (current) drug therapy: Secondary | ICD-10-CM | POA: Diagnosis not present

## 2016-05-03 DIAGNOSIS — D649 Anemia, unspecified: Secondary | ICD-10-CM | POA: Diagnosis not present

## 2016-05-03 DIAGNOSIS — R296 Repeated falls: Secondary | ICD-10-CM | POA: Diagnosis not present

## 2016-05-03 DIAGNOSIS — M21372 Foot drop, left foot: Secondary | ICD-10-CM | POA: Diagnosis not present

## 2016-05-03 DIAGNOSIS — R609 Edema, unspecified: Secondary | ICD-10-CM | POA: Diagnosis not present

## 2016-05-03 DIAGNOSIS — M542 Cervicalgia: Secondary | ICD-10-CM

## 2016-05-03 DIAGNOSIS — R2689 Other abnormalities of gait and mobility: Secondary | ICD-10-CM | POA: Diagnosis not present

## 2016-05-03 DIAGNOSIS — Z0000 Encounter for general adult medical examination without abnormal findings: Secondary | ICD-10-CM | POA: Diagnosis not present

## 2016-05-03 DIAGNOSIS — I482 Chronic atrial fibrillation: Secondary | ICD-10-CM | POA: Diagnosis not present

## 2016-05-03 DIAGNOSIS — R69 Illness, unspecified: Secondary | ICD-10-CM | POA: Diagnosis not present

## 2016-05-03 DIAGNOSIS — Z7901 Long term (current) use of anticoagulants: Secondary | ICD-10-CM | POA: Diagnosis not present

## 2016-05-03 DIAGNOSIS — E782 Mixed hyperlipidemia: Secondary | ICD-10-CM | POA: Diagnosis not present

## 2016-05-03 NOTE — Therapy (Signed)
[facility] [address]  [place], [place], [address] Phone: [phone]   Fax:  [phone]  Physical Therapy Treatment  Patient Details  Name: John [name] MRN: [id number] Date of Birth: 05-21-1926 Referring Provider: Julian Reil, MD  Encounter Date: 05/03/2016      PT End of Session - 05/03/16 1022    Visit Number 11   Number of Visits 16   Date for PT Re-Evaluation 05/21/16   PT Start Time 1017   PT Stop Time 1101   PT Time Calculation (min) 44 min   Activity Tolerance Patient tolerated treatment well   Behavior During Therapy [facility] for tasks assessed/performed;Impulsive      Past Medical History  Diagnosis Date  . Aortic stenosis   . Mixed hyperlipidemia 09/27/2013    Does not want to take statins   . History of left foot drop 01/02/2014  . GERD (gastroesophageal reflux disease)   . Lumbar disc disease   . BPH (benign prostatic hyperplasia)   . Hypertension   . Dysrhythmia     afib  . Heart murmur   . History of kidney stones   . 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 Atrial fibrillation [facility])     Past Surgical History  Procedure Laterality Date  . Lumbar laminectomy      29 yrs ago  . Total knee arthroplasty Bilateral [id number]  . Cervical laminectomy  2009  . Shoulder surgery Right 11  . Eye surgery Bilateral 09  . Transcatheter aortic valve replacement, transfemoral N/A 06/04/2014    Procedure: TRANSCATHETER AORTIC VALVE REPLACEMENT, TRANSFEMORAL;  Surgeon: [name], MD;  Location: [place];  Service: Open Heart Surgery;  Laterality: N/A;  . Intraoperative transesophageal echocardiogram N/A 06/04/2014    Procedure: INTRAOPERATIVE TRANSESOPHAGEAL ECHOCARDIOGRAM;  Surgeon: [name], MD;  Location: [facility] OR;  Service: Open Heart Surgery;  Laterality: N/A;  . Left  and right heart catheterization with coronary angiogram N/A 04/22/2014    Procedure: LEFT AND RIGHT HEART CATHETERIZATION WITH CORONARY ANGIOGRAM;  Surgeon: Blane Ohara, MD;  Location: [facility] CATH LAB;  Service: Cardiovascular;  Laterality: N/A;  . Skull surgery  2013    "plate on right side of head"    There were no vitals filed for this visit.      Subjective Assessment - 05/03/16 1020    Subjective Pt states "my neck is much better, but my balance doesn't seem to be getting any better".   Patient Stated Goals Improve walking by 50%   Currently in Pain? No/denies           TODAY'S TREATMENT  TherEx NuStep - lvl 7 x 6'  Neuro Retro step with TRX UE support to facilitate balance and weight shift (CGA required due to frequent LOB) Corner static standing balance with back of chair in front of pt (intermittent use of wall necessary with all activities with occasional seated rest breaks d/t fatigue):   Narrow BOS - eyes open/closed, horizontal head movements x10, vertical head movements x 5 (stopped s/t c/o double vision)   Rhomberg stance - eyes open/closed, horizontal head movements x10   Semi-tandem stance - eyes open/closed   Tandem stance    R SLS 3 x 5-15 sec Standing against wall - Hip peel off 2x10          PT Education -  05/03/16 1215    Education provided Yes   Education Details Corner Balance HEP   Person(s) Educated Patient   Methods Explanation;Demonstration;Handout   Comprehension Verbalized understanding;Returned demonstration;Need further instruction          PT Short Term Goals - 03/24/16 1101    PT SHORT TERM GOAL #1   Title Pt will be independent with initial cervical HEP by 03/23/16   Status Achieved   PT SHORT TERM GOAL #2   Title Pt will demonstrate awareness of appropriate placement and sequencing of cane with gait by 03/23/16   Status Achieved  Pt aware of proper technique for use of cane but prefers to use it "his way".           PT  Long Term Goals - 04/29/16 1513    PT LONG TERM GOAL #1   Title Pt will be independent with advanced HEP as indicated by 05/21/16   Status On-going   PT LONG TERM GOAL #2   Title Pt will consistently demonstrate normal gait pattern with cane during gait by 04/30/16   Status Achieved   PT LONG TERM GOAL #3   Title Pt will improve Berg Balance Scale score to >/= 28/56 to reduce risk for falls by 05/21/16   Status On-going  Currently 22/56   PT LONG TERM GOAL #4   Title Pt will report improved ability to turn head while driving by [id number]   Status Achieved  Pt is able to turn head adequately to check blind spot using mirrors.               Plan - 05/03/16 1209    Clinical Impression Statement Pt continues to demonstrate significant difficulty with maintaining static standing balance unsupported, therefore focused on static standing with varying BOS, stance position and eyes open/closed while standing with back to corner to allow for self-correction of LOB. Balance HEP created incorporating these activities and will assess response at next visit.   PT Treatment/Interventions Patient/family education;Gait training;DME Instruction;Neuromuscular re-education;Balance training;Therapeutic activities;Functional mobility training;Therapeutic exercise;Manual techniques;Passive range of motion;Ultrasound;Electrical Stimulation;Moist Heat;Cryotherapy;Traction   PT Next Visit Plan Balance/gait stability training      Patient will benefit from skilled therapeutic intervention in order to improve the following deficits and impairments:  Decreased balance, Decreased coordination, Abnormal gait, Difficulty walking, Decreased strength, Decreased safety awareness, Decreased knowledge of use of DME, Decreased range of motion, Impaired flexibility, Pain, Postural dysfunction, Improper body mechanics  Visit Diagnosis: Other abnormalities of gait and mobility  Unsteadiness on  feet  Cervicalgia     Problem List Patient Active Problem List   Diagnosis Date Noted  . Post concussion syndrome 07/23/2015  . Gait difficulty 07/23/2015  . Lumbar radiculopathy 07/23/2015  . Aortic valve disorders 06/04/2014  . 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 Severe aortic stenosis 04/24/2014  . Aortic stenosis, severe 04/04/2014  . Long-term (current) use of anticoagulants   . History of left foot drop 01/02/2014  . Leg length discrepancy 01/02/2014  . Mixed hyperlipidemia 09/27/2013  . Atrial fibrillation, permanent ([place])     Percival Spanish, PT, MPT 05/03/2016, 12:22 PM  [facility] [address]  [place], [place], [address] Phone: [phone]   Fax:  [phone]  Name: Dago Mom MRN: [id number] Date of Birth: 1926-02-25

## 2016-05-05 ENCOUNTER — Ambulatory Visit: Payer: Medicare HMO | Admitting: Physical Therapy

## 2016-05-05 DIAGNOSIS — Z7901 Long term (current) use of anticoagulants: Secondary | ICD-10-CM | POA: Diagnosis not present

## 2016-05-05 DIAGNOSIS — Z79899 Other long term (current) drug therapy: Secondary | ICD-10-CM | POA: Diagnosis not present

## 2016-05-05 DIAGNOSIS — R296 Repeated falls: Secondary | ICD-10-CM | POA: Diagnosis not present

## 2016-05-05 DIAGNOSIS — E782 Mixed hyperlipidemia: Secondary | ICD-10-CM | POA: Diagnosis not present

## 2016-05-05 DIAGNOSIS — R2689 Other abnormalities of gait and mobility: Secondary | ICD-10-CM | POA: Diagnosis not present

## 2016-05-05 DIAGNOSIS — R69 Illness, unspecified: Secondary | ICD-10-CM | POA: Diagnosis not present

## 2016-05-05 DIAGNOSIS — R2681 Unsteadiness on feet: Secondary | ICD-10-CM

## 2016-05-05 DIAGNOSIS — I482 Chronic atrial fibrillation: Secondary | ICD-10-CM | POA: Diagnosis not present

## 2016-05-05 DIAGNOSIS — Z0000 Encounter for general adult medical examination without abnormal findings: Secondary | ICD-10-CM | POA: Diagnosis not present

## 2016-05-05 DIAGNOSIS — D649 Anemia, unspecified: Secondary | ICD-10-CM | POA: Diagnosis not present

## 2016-05-05 NOTE — Therapy (Signed)
[facility] [address]  [place], [place], [address] Phone: [phone]   Fax:  [phone]  Physical Therapy Treatment  Patient Details  Name: John [name] MRN: [id number] Date of Birth: 1925-11-24 Referring Provider: Julian Reil, MD  Encounter Date: 05/05/2016      PT End of Session - 05/05/16 1020    Visit Number 12   Number of Visits 16   Date for PT Re-Evaluation 05/21/16   PT Start Time 1020   PT Stop Time 1103   PT Time Calculation (min) 43 min   Activity Tolerance Patient tolerated treatment well;Patient limited by pain   Behavior During Therapy [facility] for tasks assessed/performed      Past Medical History  Diagnosis Date  . Aortic stenosis   . Mixed hyperlipidemia 09/27/2013    Does not want to take statins   . History of left foot drop 01/02/2014  . GERD (gastroesophageal reflux disease)   . Lumbar disc disease   . BPH (benign prostatic hyperplasia)   . Hypertension   . Dysrhythmia     afib  . Heart murmur   . History of kidney stones   . 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 Atrial fibrillation [facility])     Past Surgical History  Procedure Laterality Date  . Lumbar laminectomy      29 yrs ago  . Total knee arthroplasty Bilateral [id number]  . Cervical laminectomy  2009  . Shoulder surgery Right 11  . Eye surgery Bilateral 09  . Transcatheter aortic valve replacement, transfemoral N/A 06/04/2014    Procedure: TRANSCATHETER AORTIC VALVE REPLACEMENT, TRANSFEMORAL;  Surgeon: [name], MD;  Location: [place];  Service: Open Heart Surgery;  Laterality: N/A;  . Intraoperative transesophageal echocardiogram N/A 06/04/2014    Procedure: INTRAOPERATIVE TRANSESOPHAGEAL ECHOCARDIOGRAM;  Surgeon: [name], MD;  Location: [facility] OR;  Service: Open Heart Surgery;  Laterality:  N/A;  . Left and right heart catheterization with coronary angiogram N/A 04/22/2014    Procedure: LEFT AND RIGHT HEART CATHETERIZATION WITH CORONARY ANGIOGRAM;  Surgeon: Blane Ohara, MD;  Location: [facility] CATH LAB;  Service: Cardiovascular;  Laterality: N/A;  . Skull surgery  2013    "plate on right side of head"    There were no vitals filed for this visit.      Subjective Assessment - 05/05/16 1025    Subjective Pt reporting increased soreness in low back which he attributes to completing his HEP, but unable to identify particular exercise(s) which trigger the pain. Pt reports [facility].   Currently in Pain? Yes   Pain Score --  7-8/10   Pain Location Back   Pain Orientation Lower   Pain Descriptors / Indicators Aching           TODAY'S TREATMENT  TherEx NuStep - lvl 7 x 6'  Manual B HS, SKTC, Glute & Piriformis stretches 2x30" B mod thomas hip flexor stretch 2x30"  TherEx DKTC with heels on peanut ball 20x3" LTR 10x5" Seated Low Row with blue TB x10 B Crossbody Row with slight trunk rotation with blue TB x8           PT Short Term Goals - 03/24/16 1101    PT SHORT TERM GOAL #1   Title Pt will be independent with initial cervical HEP by 03/23/16  Status Achieved   PT SHORT TERM GOAL #2   Title Pt will demonstrate awareness of appropriate placement and sequencing of cane with gait by 03/23/16   Status Achieved  Pt aware of proper technique for use of cane but prefers to use it "his way".           PT Long Term Goals - 04/29/16 1513    PT LONG TERM GOAL #1   Title Pt will be independent with advanced HEP as indicated by 05/21/16   Status On-going   PT LONG TERM GOAL #2   Title Pt will consistently demonstrate normal gait pattern with cane during gait by 04/30/16   Status Achieved   PT LONG TERM GOAL #3   Title Pt will improve Berg Balance Scale score to >/= 28/56 to reduce risk for falls by 05/21/16   Status On-going   Currently 22/56   PT LONG TERM GOAL #4   Title Pt will report improved ability to turn head while driving by [id number]   Status Achieved  Pt is able to turn head adequately to check blind spot using mirrors.               Plan - 05/05/16 1105    Clinical Impression Statement Pt reporting significant increase in LBP today which he thinks is stemming from his HEP but is unable to identify specific exercise(s) which triggered the pain. Deferred balance exercises today and focused on stretchening and lumbar stabilization exercises to help alleviate the back pain, with pt reporting pain better by end of session. Will hopefully be abletto resume balance training at next visit.   PT Treatment/Interventions Patient/family education;Gait training;DME Instruction;Neuromuscular re-education;Balance training;Therapeutic activities;Functional mobility training;Therapeutic exercise;Manual techniques;Passive range of motion;Ultrasound;Electrical Stimulation;Moist Heat;Cryotherapy;Traction   PT Next Visit Plan Balance/gait stability training   Consulted and Agree with Plan of Care Patient      Patient will benefit from skilled therapeutic intervention in order to improve the following deficits and impairments:  Decreased balance, Decreased coordination, Abnormal gait, Difficulty walking, Decreased strength, Decreased safety awareness, Decreased knowledge of use of DME, Decreased range of motion, Impaired flexibility, Pain, Postural dysfunction, Improper body mechanics  Visit Diagnosis: Other abnormalities of gait and mobility  Unsteadiness on feet     Problem List Patient Active Problem List   Diagnosis Date Noted  . Post concussion syndrome 07/23/2015  . Gait difficulty 07/23/2015  . Lumbar radiculopathy 07/23/2015  . Aortic valve disorders 06/04/2014  . 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 Severe aortic stenosis 04/24/2014  . Aortic stenosis, severe 04/04/2014   . Long-term (current) use of anticoagulants   . History of left foot drop 01/02/2014  . Leg length discrepancy 01/02/2014  . Mixed hyperlipidemia 09/27/2013  . Atrial fibrillation, permanent ([place])     Percival Spanish, PT, MPT 05/05/2016, 1:31 PM  [facility] [address]  [place], [place], [address] Phone: [phone]   Fax:  [phone]  Name: Tarrell Leadbeater MRN: [id number] Date of Birth: 12-02-1925

## 2016-05-10 ENCOUNTER — Ambulatory Visit: Payer: Medicare HMO | Admitting: Physical Therapy

## 2016-05-10 DIAGNOSIS — R2689 Other abnormalities of gait and mobility: Secondary | ICD-10-CM

## 2016-05-10 DIAGNOSIS — R2681 Unsteadiness on feet: Secondary | ICD-10-CM

## 2016-05-10 NOTE — Therapy (Signed)
[facility] [address]  [place], [place], [address] Phone: [phone]   Fax:  [phone]  Physical Therapy Treatment  Patient Details  Name: John [name] MRN: [id number] Date of Birth: [birth year] Referring Provider: Julian Reil, MD  Encounter Date: 05/10/2016      PT End of Session - 05/10/16 1015    Visit Number 13   Number of Visits 16   Date for PT Re-Evaluation 05/21/16   PT Start Time [id number]   PT Stop Time 1100   PT Time Calculation (min) 45 min   Activity Tolerance Patient tolerated treatment well;Patient limited by pain   Behavior During Therapy [facility] for tasks assessed/performed      Past Medical History:  Diagnosis Date  . Aortic stenosis   . Atrial fibrillation ([place])   . BPH (benign prostatic hyperplasia)   . Dysrhythmia    afib  . GERD (gastroesophageal reflux disease)   . Heart murmur   . History of kidney stones   . History of left foot drop 01/02/2014  . Hypertension   . Lumbar disc disease   . Mixed hyperlipidemia 09/27/2013   Does not want to take statins   . 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 CERVICAL LAMINECTOMY  2009  . EYE SURGERY Bilateral 09  . INTRAOPERATIVE TRANSESOPHAGEAL ECHOCARDIOGRAM N/A 06/04/2014   Procedure: INTRAOPERATIVE TRANSESOPHAGEAL ECHOCARDIOGRAM;  Surgeon: [name], MD;  Location: [facility] OR;  Service: Open Heart Surgery;  Laterality: N/A;  . LEFT AND RIGHT HEART CATHETERIZATION WITH CORONARY ANGIOGRAM N/A 04/22/2014   Procedure: LEFT AND RIGHT HEART CATHETERIZATION WITH CORONARY ANGIOGRAM;  Surgeon: Blane Ohara, MD;  Location: [facility] CATH LAB;  Service: Cardiovascular;  Laterality: N/A;  . LUMBAR LAMINECTOMY     29 yrs ago  . SHOULDER SURGERY Right 11  . skull surgery  2013   "plate on right side of head"  . TOTAL KNEE ARTHROPLASTY Bilateral [id number]  . TRANSCATHETER AORTIC VALVE REPLACEMENT, TRANSFEMORAL N/A 06/04/2014   Procedure: TRANSCATHETER AORTIC VALVE REPLACEMENT, TRANSFEMORAL;  Surgeon: [name], MD;  Location: [place];  Service: Open Heart Surgery;  Laterality: N/A;    There were no vitals filed for this visit.      Subjective Assessment - 05/10/16 1018    Subjective Pt continues to report his neck is much better. States he had difficulty performing the corner balance exercises, but thinks it was because he trying to do the activities w/o shoes or AFO.   Patient Stated Goals Improve walking by 50%   Currently in Pain? No/denies           TODAY'S TREATMENT  TherEx NuStep - lvl 7 x 7'  Neuro Corner static standing balance with back of chair in front of pt (intermittent use of wall necessary with all activities with occasional seated rest breaks d/t fatigue):    Narrow BOS -        Eyes open/closed        Horizontal head movements x10       Trunk rotation x10       Vertical head movements x10       Marching in place    Rhomberg stance -       Eyes open/closed       Horizontal head movements x10  Semi-tandem stance - Alt fwd foot       Eyes open/closed       Rhomberg    Tandem stance - Alt fwd foot       Eyes open    R SLS 3 x 10-20 sec    L SLS 3 x 3-5 sec Standing against wall - Hip peel off 2x10 At side of treadmill:    Sidestepping with intermittent UE support    Walking backward with intermittent single UE support          PT Education - 05/10/16 1102    Education provided Yes   Education Details Provided [place] program for pt to read in prep for training at next visit   Person(s) Educated Patient   Methods Explanation;Handout   Comprehension Verbalized understanding          PT Short Term Goals - 03/24/16 1101      PT SHORT TERM GOAL #1   Title Pt will be independent with initial cervical HEP by 03/23/16    Status Achieved     PT SHORT TERM GOAL #2   Title Pt will demonstrate awareness of appropriate placement and sequencing of cane with gait by 03/23/16   Status Achieved  Pt aware of proper technique for use of cane but prefers to use it "his way".           PT Long Term Goals - 04/29/16 1513      PT LONG TERM GOAL #1   Title Pt will be independent with advanced HEP as indicated by 05/21/16   Status On-going     PT LONG TERM GOAL #2   Title Pt will consistently demonstrate normal gait pattern with cane during gait by 04/30/16   Status Achieved     PT LONG TERM GOAL #3   Title Pt will improve Berg Balance Scale score to >/= 28/56 to reduce risk for falls by 05/21/16   Status On-going  Currently 22/56     PT LONG TERM GOAL #4   Title Pt will report improved ability to turn head while driving by [id number]   Status Achieved  Pt is able to turn head adequately to check blind spot using mirrors.               Plan - 05/10/16 1055    Clinical Impression Statement Pt reports LBP resolved and neck continues to improve. Pt states difficulty with corner balance program at home but thinks it may be due to attempting activities w/o shoes or AFO, and given pt's improvement in performance during therapy session, this was likely the case. Provided reference material for [facility] program for pt to review and plan to train pt in relevant activities at next visit.   PT Treatment/Interventions Patient/family education;Gait training;DME Instruction;Neuromuscular re-education;Balance training;Therapeutic activities;Functional mobility training;Therapeutic exercise;Manual techniques;Passive range of motion;Ultrasound;Electrical Stimulation;Moist Heat;Cryotherapy;Traction   PT Next Visit Plan Balance/gait stability training; [employer] and Agree with Plan of Care Patient      Patient will benefit from skilled therapeutic intervention in order to improve the following deficits and  impairments:  Decreased balance, Decreased coordination, Abnormal gait, Difficulty walking, Decreased strength, Decreased safety awareness, Decreased knowledge of use of DME, Decreased range of motion, Impaired flexibility, Pain, Postural dysfunction, Improper body mechanics  Visit Diagnosis: Other abnormalities of gait and mobility  Unsteadiness on feet     Problem List Patient Active Problem List   Diagnosis Date Noted  . Post concussion syndrome  07/23/2015  . Gait difficulty 07/23/2015  . Lumbar radiculopathy 07/23/2015  . Aortic valve disorders 06/04/2014  . 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 Severe aortic stenosis 04/24/2014  . Aortic stenosis, severe 04/04/2014  . Long-term (current) use of anticoagulants   . History of left foot drop 01/02/2014  . Leg length discrepancy 01/02/2014  . Mixed hyperlipidemia 09/27/2013  . Atrial fibrillation, permanent ([place])     Percival Spanish, PT, MPT 05/10/2016, 12:40 PM  [facility] [address]  [place], [place], [address] Phone: [phone]   Fax:  [phone]  Name: John [name] MRN: [id number] Date of Birth: 1926-05-04

## 2016-05-12 ENCOUNTER — Ambulatory Visit: Payer: Medicare HMO | Admitting: Physical Therapy

## 2016-05-12 DIAGNOSIS — R2681 Unsteadiness on feet: Secondary | ICD-10-CM

## 2016-05-12 DIAGNOSIS — R2689 Other abnormalities of gait and mobility: Secondary | ICD-10-CM

## 2016-05-12 NOTE — Therapy (Signed)
[facility] [address]  [place], [place], [address] Phone: [phone]   Fax:  [phone]  Physical Therapy Treatment  Patient Details  Name: John [name] MRN: [id number] Date of Birth: 12-02-1925 Referring Provider: Julian Reil, MD  Encounter Date: 05/12/2016      PT End of Session - 05/12/16 1024    Visit Number 14   Number of Visits 16   Date for PT Re-Evaluation 05/21/16   PT Start Time 1024   PT Stop Time 1105   PT Time Calculation (min) 41 min   Activity Tolerance Patient tolerated treatment well;Patient limited by pain   Behavior During Therapy [facility] for tasks assessed/performed      Past Medical History:  Diagnosis Date  . Aortic stenosis   . Atrial fibrillation ([place])   . BPH (benign prostatic hyperplasia)   . Dysrhythmia    afib  . GERD (gastroesophageal reflux disease)   . Heart murmur   . History of kidney stones   . History of left foot drop 01/02/2014  . Hypertension   . Lumbar disc disease   . Mixed hyperlipidemia 09/27/2013   Does not want to take statins   . 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 CERVICAL LAMINECTOMY  2009  . EYE SURGERY Bilateral 09  . INTRAOPERATIVE TRANSESOPHAGEAL ECHOCARDIOGRAM N/A 06/04/2014   Procedure: INTRAOPERATIVE TRANSESOPHAGEAL ECHOCARDIOGRAM;  Surgeon: [name], MD;  [facility];  Service: Open Heart Surgery;  Laterality: N/A;  . LEFT AND RIGHT HEART CATHETERIZATION WITH CORONARY ANGIOGRAM N/A 04/22/2014   Procedure: LEFT AND RIGHT HEART CATHETERIZATION WITH CORONARY ANGIOGRAM;  Surgeon: Blane Ohara, MD;  Location[facility] C[facility] LAB;  Service: Cardiovascular;  Laterality: N/A;  . LUMBAR LAMINECTOMY     29 yrs ago  . SHOULDER SURGERY Right 11  . skull surgery  2013   "plate on right side of head"  . TOTAL KNEE ARTHROPLASTY Bilateral [id number]  . TRANSCATHETER AORTIC VALVE REPLACEMENT, TRANSFEMORAL N/A 06/04/2014   Procedure: TRANSCATHETER AORTIC VALVE REPLACEMENT, TRANSFEMORAL;  Surgeon: [name], MD;  Location: [place];  Service: Open Heart Surgery;  Laterality: N/A;    There were no vitals filed for this visit.      Subjective Assessment - 05/12/16 1024    Subjective Pt's only concern today is continued balance issues.   Patient Stated Goals Improve walking by 50%   Currently in Pain? Yes   Pain Score --  1-2/10   Pain Location Neck           TODAY'S TREATMENT  TherEx NuStep - lvl 7 x 5'  Neuro [place] program:    B Trunk rotation in standing x5    R Ankle pumps    B Standing HS curls x15    B Hip Abduction & Extension x15 each    B Calf/Toe raises x20 each    Partial squats x20    Walking backward with single UE support on counter x4    Figure 8 with cane x3    Heel to toe (tandem) stance x15 sec    Heel to toe (tandem) gait fwd x2    Sidestepping (hands hovering over counter) x2    SLS with single UE support x 5-10 sec          PT  Education - 05/12/16 1303    Education provided Yes   Education Details Instruction in [place] fall prevention program   Person(s) Educated Patient   Methods Explanation;Demonstration;Handout   Comprehension Verbalized understanding;Returned demonstration;Need further instruction          PT Short Term Goals - 03/24/16 1101      PT SHORT TERM GOAL #1   Title Pt will be independent with initial cervical HEP by 03/23/16   Status Achieved     PT SHORT TERM GOAL #2   Title Pt will demonstrate awareness of appropriate placement and sequencing of cane with gait by 03/23/16   Status Achieved  Pt aware of proper technique for use of cane but prefers to use it "his way".           PT Long Term Goals - 05/12/16 1150      PT LONG TERM GOAL #1   Title Pt will be independent with  advanced HEP as indicated by 05/21/16   Status On-going     PT LONG TERM GOAL #2   Title Pt will consistently demonstrate normal gait pattern with cane during gait by 04/30/16   Status Achieved     PT LONG TERM GOAL #3   Title Pt will improve Berg Balance Scale score to >/= 28/56 to reduce risk for falls by 05/21/16   Status On-going  Currently 22/56     PT LONG TERM GOAL #4   Title Pt will report improved ability to turn head while driving by [id number]   Status Achieved  Pt is able to turn head adequately to check blind spot using mirrors.               Plan - 05/12/16 1105    Clinical Impression Statement Provided training [place] fall prevention program today, focusing on brief review of familiar strengthening exercises with increased time spent on balance and stepping activities. Pt able to perform most activities with varying degree of UE support, with exception of toe and heel walking limited due to AFO. Will plan to review [place] program at next visit and begin standardized testing to determine if pt ready to transition to HEP or if further therapy indicated.   PT Treatment/Interventions Patient/family education;Gait training;DME Instruction;Neuromuscular re-education;Balance training;Therapeutic activities;Functional mobility training;Therapeutic exercise;Manual techniques;Passive range of motion;Ultrasound;Electrical Stimulation;Moist Heat;Cryotherapy;Traction   PT Next Visit Plan Review [facility]; Begin standardized testing to determine readiness for D/C vs need for recert   Consulted and Agree with Plan of Care Patient      Patient will benefit from skilled therapeutic intervention in order to improve the following deficits and impairments:  Decreased balance, Decreased coordination, Abnormal gait, Difficulty walking, Decreased strength, Decreased safety awareness, Decreased knowledge of use of DME, Decreased range of motion, Impaired flexibility, Pain, Postural dysfunction,  Improper body mechanics  Visit Diagnosis: Other abnormalities of gait and mobility  Unsteadiness on feet     Problem List Patient Active Problem List   Diagnosis Date Noted  . Post concussion syndrome 07/23/2015  . Gait difficulty 07/23/2015  . Lumbar radiculopathy 07/23/2015  . Aortic valve disorders 06/04/2014  . 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 Severe aortic stenosis 04/24/2014  . Aortic stenosis, severe 04/04/2014  . Long-term (current) use of anticoagulants   . History of left foot drop 01/02/2014  . Leg length discrepancy 01/02/2014  . Mixed hyperlipidemia 09/27/2013  . Atrial fibrillation, permanent ([place])     Percival Spanish, PT, MPT 05/12/2016, 1:04 PM  [facility] [address]  [place], [place], [address] Phone: [phone]   Fax:  [phone]  Name: John [name] MRN: [id number] Date of Birth: 18-Jul-1926

## 2016-05-13 DIAGNOSIS — H353211 Exudative age-related macular degeneration, right eye, with active choroidal neovascularization: Secondary | ICD-10-CM | POA: Diagnosis not present

## 2016-05-13 DIAGNOSIS — H353121 Nonexudative age-related macular degeneration, left eye, early dry stage: Secondary | ICD-10-CM | POA: Diagnosis not present

## 2016-05-17 ENCOUNTER — Ambulatory Visit: Payer: Medicare HMO | Admitting: Physical Therapy

## 2016-05-17 DIAGNOSIS — R2689 Other abnormalities of gait and mobility: Secondary | ICD-10-CM | POA: Diagnosis not present

## 2016-05-17 DIAGNOSIS — R2681 Unsteadiness on feet: Secondary | ICD-10-CM

## 2016-05-17 NOTE — Therapy (Signed)
[facility] [address]  [place], [place], [address] Phone: [phone]   Fax:  [phone]  Physical Therapy Treatment  Patient Details  Name: John [name] MRN: [id number] Date of Birth: [birth year] Referring Provider: Julian Reil, MD  Encounter Date: 05/17/2016      PT End of Session - 05/17/16 1012    Visit Number 15   Number of Visits 16   Date for PT Re-Evaluation 05/21/16   PT Start Time 1012   PT Stop Time 1104   PT Time Calculation (min) 52 min   Activity Tolerance Patient tolerated treatment well;Patient limited by pain   Behavior During Therapy [facility] for tasks assessed/performed      Past Medical History:  Diagnosis Date  . Aortic stenosis   . Atrial fibrillation ([place])   . BPH (benign prostatic hyperplasia)   . Dysrhythmia    afib  . GERD (gastroesophageal reflux disease)   . Heart murmur   . History of kidney stones   . History of left foot drop 01/02/2014  . Hypertension   . Lumbar disc disease   . Mixed hyperlipidemia 09/27/2013   Does not want to take statins   . 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 CERVICAL LAMINECTOMY  2009  . EYE SURGERY Bilateral 09  . INTRAOPERATIVE TRANSESOPHAGEAL ECHOCARDIOGRAM N/A 06/04/2014   Procedure: INTRAOPERATIVE TRANSESOPHAGEAL ECHOCARDIOGRAM;  Surgeon: [name], MD;  Location: [facility] OR;  Service: Open Heart Surgery;  Laterality: N/A;  . LEFT AND RIGHT HEART CATHETERIZATION WITH CORONARY ANGIOGRAM N/A 04/22/2014   Procedure: LEFT AND RIGHT HEART CATHETERIZATION WITH CORONARY ANGIOGRAM;  Surgeon: Blane Ohara, MD;  Location: [facility] CATH LAB;  Service: Cardiovascular;  Laterality: N/A;  . LUMBAR LAMINECTOMY     29 yrs ago  . SHOULDER SURGERY Right 11  . skull surgery  2013   "plate on right side of head"  . TOTAL KNEE ARTHROPLASTY Bilateral [id number]  . TRANSCATHETER AORTIC VALVE REPLACEMENT, TRANSFEMORAL N/A 06/04/2014   Procedure: TRANSCATHETER AORTIC VALVE REPLACEMENT, TRANSFEMORAL;  Surgeon: [name], MD;  Location: [facility];  Service: Open Heart Surgery;  Laterality: N/A;    There were no vitals filed for this visit.      Subjective Assessment - 05/17/16 1012    Subjective Pt w/o any pain today and has not needed to use any oral or topical pain meds for 2 days.   Patient Stated Goals Improve walking by 50%   Currently in Pain? No/denies            [facility] PT Assessment - 05/17/16 1012      Strength   Right Hip Flexion 5/5   Right Hip Extension 4/5   Right Hip ABduction 5/5   Right Hip ADduction 5/5   Left Hip Flexion 5/5   Left Hip Extension 4/5   Left Hip ABduction 5/5   Left Hip ADduction 5/5   Right Knee Flexion 5/5   Right Knee Extension 5/5   Left Knee Flexion 5/5   Left Knee Extension 5/5   Right Ankle Dorsiflexion 4+/5     Standardized Balance Assessment   Standardized Balance Assessment Berg Balance Test     Berg Balance Test   Sit to Stand Able to stand without using hands and stabilize independently   Standing Unsupported  Able to stand safely 2 minutes   Sitting with Back Unsupported but Feet Supported on Floor or Stool Able to sit safely and securely 2 minutes   Stand to Sit Sits safely with minimal use of hands   Transfers Able to transfer safely, minor use of hands   Standing Unsupported with Eyes Closed Able to stand 3 seconds   Standing Ubsupported with Feet Together Able to place feet together independently and stand for 1 minute with supervision   From Standing, Reach Forward with Outstretched Arm Can reach forward >12 cm safely (5")   From Standing Position, Pick up Object from Floor Unable to pick up shoe, but reaches 2-5 cm (1-2") from shoe and balances independently   From Standing Position, Turn to Look Behind  Over each Shoulder Needs supervision when turning   Turn 360 Degrees Needs close supervision or verbal cueing   Standing Unsupported, Alternately Place Feet on Step/Stool Needs assistance to keep from falling or unable to try   Standing Unsupported, One Foot in [employer] balance while stepping or standing   Standing on One Leg Tries to lift leg/unable to hold 3 seconds but remains standing independently   Total Score 33          TODAY'S TREATMENT  TherEx NuStep - lvl 7 x 7'  PPT LE MMT Berg Balance Scale          PT Short Term Goals - 03/24/16 1101      PT SHORT TERM GOAL #1   Title Pt will be independent with initial cervical HEP by 03/23/16   Status Achieved     PT SHORT TERM GOAL #2   Title Pt will demonstrate awareness of appropriate placement and sequencing of cane with gait by 03/23/16   Status Achieved  Pt aware of proper technique for use of cane but prefers to use it "his way".           PT Long Term Goals - 05/17/16 1041      PT LONG TERM GOAL #1   Title Pt will be independent with advanced HEP as indicated by 05/21/16   Status Achieved  Achieved for neck HEP     PT LONG TERM GOAL #2   Title Pt will consistently demonstrate normal gait pattern with cane during gait by 04/30/16   Status Achieved     PT LONG TERM GOAL #3   Title Pt will improve Berg Balance Scale score to >/= 28/56 to reduce risk for falls by 05/21/16   Status Achieved  Currently 34/56     PT LONG TERM GOAL #4   Title Pt will report improved ability to turn head while driving by [id number]   Status Achieved     PT LONG TERM GOAL #5   Title Pt will improve Berg Balance Scale score to >/= 37/56 to reduce risk for falls by 06/18/16   Status New     PT LONG TERM GOAL #6   Title Pt will report ability to stand at the sink for 5 minutes w/o leaning into sink to maintain balance by 06/18/16   Status New     PT LONG TERM GOAL #7   Title Independent with [place] fall prevention program by  06/18/16   Status New               Plan - 05/17/16 1100    Clinical Impression Statement Pt has demonstrated excellent progress with PT so far. Pt reporting significant improvement in  functional neck ROM with ability to adequately turn head to check blind spot while driving and noting pain only on a rare occasion at present. 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 Pt would like to continue PT to address these deficits, therefore will plan to recert for additional 8 visits (2x/wk x 4 wks) as of next visit (last remaining visit in [place]).    Rehab Potential Good   Clinical Impairments Affecting Rehab Potential Lumbar DDD with radiculopathy & L foot drop, chronic afib, severe aortic stenosis   PT Frequency 2x / week   PT Duration 4 weeks   PT Treatment/Interventions Patient/family education;Gait training;DME Instruction;Neuromuscular re-education;Balance training;Therapeutic activities;Functional mobility training;Therapeutic exercise;Manual techniques;Passive range of motion;Ultrasound;Electrical Stimulation;Moist Heat;Cryotherapy;Traction   PT Next Visit Plan Recert; Consider standardized dynamic gait testing with FGA or DGI; Review [place] program; Balance and dynamic gait activities   Consulted and Agree with Plan of Care Patient      Patient will benefit from skilled therapeutic intervention in order to improve the following deficits and impairments:  Decreased balance, Decreased coordination, Abnormal gait, Difficulty walking, Decreased strength, Decreased safety awareness, Decreased knowledge of use of DME, Decreased range of motion, Impaired flexibility, Pain, Postural dysfunction, Improper body mechanics  Visit Diagnosis: Other abnormalities of gait  and mobility  Unsteadiness on feet     Problem List Patient Active Problem List   Diagnosis Date Noted  . Post concussion syndrome 07/23/2015  . Gait difficulty 07/23/2015  . Lumbar radiculopathy 07/23/2015  . Aortic valve disorders 06/04/2014  . 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 Severe aortic stenosis 04/24/2014  . Aortic stenosis, severe 04/04/2014  . Long-term (current) use of anticoagulants   . History of left foot drop 01/02/2014  . Leg length discrepancy 01/02/2014  . Mixed hyperlipidemia 09/27/2013  . Atrial fibrillation, permanent ([place])     Percival Spanish, PT, MPT 05/17/2016, 12:43 PM  [facility] [address]  [place], [place], [address] Phone: [phone]   Fax:  [phone]  Name: John [name] MRN: [id number] Date of Birth: 1925-12-22

## 2016-05-19 ENCOUNTER — Ambulatory Visit: Payer: Medicare HMO | Attending: Unknown Physician Specialty | Admitting: Physical Therapy

## 2016-05-19 DIAGNOSIS — R2681 Unsteadiness on feet: Secondary | ICD-10-CM | POA: Diagnosis not present

## 2016-05-19 DIAGNOSIS — R2689 Other abnormalities of gait and mobility: Secondary | ICD-10-CM | POA: Diagnosis not present

## 2016-05-19 NOTE — Therapy (Signed)
[facility] [address]  [place], [place], [address] Phone: [phone]   Fax:  [phone]  Physical Therapy Treatment  Patient Details  Name: John [name] MRN: [id number] Date of Birth: [birth year] Referring Provider: Julian Reil, MD  Encounter Date: 05/19/2016      PT End of Session - 05/19/16 1017    Visit Number 16   Number of Visits 24   Date for PT Re-Evaluation 06/18/16   PT Start Time 1017   PT Stop Time 1105   PT Time Calculation (min) 48 min   Activity Tolerance Patient tolerated treatment well   Behavior During Therapy [facility] for tasks assessed/performed      Past Medical History:  Diagnosis Date  . Aortic stenosis   . Atrial fibrillation ([place])   . BPH (benign prostatic hyperplasia)   . Dysrhythmia    afib  . GERD (gastroesophageal reflux disease)   . Heart murmur   . History of kidney stones   . History of left foot drop 01/02/2014  . Hypertension   . Lumbar disc disease   . Mixed hyperlipidemia 09/27/2013   Does not want to take statins   . 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 CERVICAL LAMINECTOMY  2009  . EYE SURGERY Bilateral 09  . INTRAOPERATIVE TRANSESOPHAGEAL ECHOCARDIOGRAM N/A 06/04/2014   Procedure: INTRAOPERATIVE TRANSESOPHAGEAL ECHOCARDIOGRAM;  Surgeon: [name], MD;  Location: [facility] OR;  Service: Open Heart Surgery;  Laterality: N/A;  . LEFT AND RIGHT HEART CATHETERIZATION WITH CORONARY ANGIOGRAM N/A 04/22/2014   Procedure: LEFT AND RIGHT HEART CATHETERIZATION WITH CORONARY ANGIOGRAM;  Surgeon: Blane Ohara, MD;  Location: [facility] CATH LAB;  Service: Cardiovascular;  Laterality: N/A;  . LUMBAR LAMINECTOMY     29 yrs ago  . SHOULDER SURGERY Right 11  . skull surgery  2013   "plate on right side of  head"  . TOTAL KNEE ARTHROPLASTY Bilateral [id number]  . TRANSCATHETER AORTIC VALVE REPLACEMENT, TRANSFEMORAL N/A 06/04/2014   Procedure: TRANSCATHETER AORTIC VALVE REPLACEMENT, TRANSFEMORAL;  Surgeon: [name], MD;  Location: [facility];  Service: Open Heart Surgery;  Laterality: N/A;    There were no vitals filed for this visit.      Subjective Assessment - 05/19/16 1017    Subjective Pt noting somewhat increased pain (4/10) in neck last evening while looking at the computer, but better this morning.   Patient Stated Goals Improve walking by 50%   Currently in Pain? 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, table tennis     Strength   Right Hip Flexion 5/5   Right Hip Extension 4/5   Right Hip ABduction 5/5   Right Hip ADduction 5/5  Left Hip Flexion 5/5   Left Hip Extension 4/5   Left Hip ABduction 5/5   Left Hip ADduction 5/5   Right Knee Flexion 5/5   Right Knee Extension 5/5   Left Knee Flexion 5/5   Left Knee Extension 5/5   Right Ankle Dorsiflexion 4+/5     Standardized Balance Assessment   Standardized Balance Assessment Berg Balance Test     Berg Balance Test   Sit to Stand Able to stand without using hands and stabilize independently   Standing Unsupported Able to stand safely 2 minutes   Sitting with Back Unsupported but Feet Supported on Floor or Stool Able to sit safely and securely 2 minutes   Stand to Sit Sits  safely with minimal use of hands   Transfers Able to transfer safely, minor use of hands   Standing Unsupported with Eyes Closed Able to stand 3 seconds   Standing Ubsupported with Feet Together Able to place feet together independently and stand for 1 minute with supervision   From Standing, Reach Forward with Outstretched Arm Can reach forward >12 cm safely (5")   From Standing Position, Pick up Object from Floor Unable to pick up shoe, but reaches 2-5 cm (1-2") from shoe and balances independently   From Standing Position, Turn to Look Behind Over each Shoulder Needs supervision when turning   Turn 360 Degrees Needs close supervision or verbal cueing   Standing Unsupported, Alternately Place Feet on Step/Stool Needs assistance to keep from falling or unable to try   Standing Unsupported, One Foot in [employer] balance while stepping or standing   Standing on One Leg Tries to lift leg/unable to hold 3 seconds but remains standing independently   Total Score 33           TODAY'S TREATMENT  TherEx NuStep - lvl 7 x 7'  Neuro Sit <> Stand w/o UE assist x5 At counter top:    B Sidestepping x2; hands hovering over counter    B Sidestepping with yellow TB around ankles x1; light UE support on counter    Retro gait with yellow TB - alt step-to pattern x1, step-through x1; single UE support on counter    B 3 way SLR (flexion, abduction, extension) with looped yellow TB x10 each    Heel-toe weight shift on blue foam Airex pad x10; single UE support on counter, CGA/SBA of PT    Marching on blue foam Airex pad x10 with single UE support on counter, CGA/SBA of PT; x5 w/o UE support, min/mod assist of PT due to posterior LOB    Standing on blue foam Airex Alt Step-touch to 8" step; light UE assist on counter, CGA/SBA of PT TRX support:    Lateral step over 1/2 FR x10; CGA/SBA of PT    Squat x10; CGA/SBA with repeated cues for posterior weight shift to avoid toes past knees             PT Short Term Goals - 05/19/16 1105      PT SHORT TERM GOAL #1   Title Pt will be independent with initial cervical HEP by 03/23/16   Status Achieved     PT SHORT TERM GOAL #2   Title Pt will demonstrate awareness of appropriate placement and sequencing of cane with gait by 03/23/16   Status Achieved           PT Long Term Goals - 05/19/16 1105      PT LONG TERM GOAL #  1   Title Pt will be independent with advanced HEP as indicated by 05/21/16   Status Achieved  Achieved for neck HEP     PT LONG TERM GOAL #2   Title Pt will consistently demonstrate normal gait pattern with cane during gait by 04/30/16   Status Achieved     PT LONG TERM GOAL #3   Title Pt will improve Berg Balance Scale score to >/= 28/56 to reduce risk for falls by 05/21/16   Status Achieved  Currently 34/56     PT LONG TERM GOAL #4   Title Pt will report improved ability to turn head while driving by [id number]   Status Achieved     PT LONG TERM GOAL #5   Title Pt will improve Berg Balance Scale score to >/= 37/56 to reduce risk for falls by 06/18/16   Status New     PT LONG TERM GOAL #6   Title Pt will report ability to stand at the sink for 5 minutes w/o leaning into sink to maintain balance by 06/18/16   Status New     PT LONG TERM GOAL #7   Title Independent with [place] fall prevention program by 06/18/16   Status New               Plan - 05/19/16 1105    Clinical Impression Statement John [name] has demonstrated excellent progress with PT so far. He reports significant improvement in functional neck ROM with ability to adequately turn head to check blind spot while driving and noting pain only on a rare occasion at present. LE strength overall improved to 5/5 with exception of B hip extension 4/5 and R ankle DF 4+/5. Balance also improving with current Berg Balance Scale score improved to 33/56 (from 15/56 on eval) however pt still noting inconsistency with static standing noting having to lean into edge of  counter while working at sink and difficulty moving at slower pace in congested areas, with continued posterior and/or lateral LOB most common. Pt pleased with progress with neck and comfortable continuing with HEP for this, but would like to continue PT to address balance and gait instability deficits, therefore will recert for additional 8 visits (2x/wk x 4 wks).   Rehab Potential Good   Clinical Impairments Affecting Rehab Potential Lumbar DDD with radiculopathy & L foot drop requiring L AFO, chronic afib, severe aortic stenosis   PT Frequency 2x / week   PT Duration 4 weeks   PT Treatment/Interventions Patient/family education;Gait training;DME Instruction;Neuromuscular re-education;Balance training;Therapeutic activities;Functional mobility training;Therapeutic exercise;Manual techniques;Passive range of motion;Ultrasound;Electrical Stimulation;Moist Heat;Cryotherapy;Traction   PT Next Visit Plan Consider standardized dynamic gait testing with FGA or DGI; Review [place] program; Balance and dynamic gait activities   Consulted and Agree with Plan of Care Patient      Patient will benefit from skilled therapeutic intervention in order to improve the following deficits and impairments:  Decreased balance, Decreased coordination, Abnormal gait, Difficulty walking, Decreased strength, Decreased safety awareness, Decreased knowledge of use of DME, Decreased range of motion, Impaired flexibility, Pain, Postural dysfunction, Improper body mechanics  Visit Diagnosis: Other abnormalities of gait and mobility  Unsteadiness on feet     Problem List Patient Active Problem List   Diagnosis Date Noted  . Post concussion syndrome 07/23/2015  . Gait difficulty 07/23/2015  . Lumbar radiculopathy 07/23/2015  . Aortic valve disorders 06/04/2014  . 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 Severe aortic stenosis 04/24/2014  . Aortic stenosis, severe  04/04/2014  . Long-term  (current) use of anticoagulants   . History of left foot drop 01/02/2014  . Leg length discrepancy 01/02/2014  . Mixed hyperlipidemia 09/27/2013  . Atrial fibrillation, permanent ([place])     Percival Spanish, PT, MPT 05/19/2016, 11:26 AM  [facility] [address]  [place], [place], [address] Phone: [phone]   Fax:  [phone]  Name: John [name] MRN: [id number] Date of Birth: 1926-05-10

## 2016-05-26 ENCOUNTER — Ambulatory Visit: Payer: Medicare HMO | Admitting: Physical Therapy

## 2016-05-26 DIAGNOSIS — R2689 Other abnormalities of gait and mobility: Secondary | ICD-10-CM

## 2016-05-26 DIAGNOSIS — R2681 Unsteadiness on feet: Secondary | ICD-10-CM

## 2016-05-26 NOTE — Therapy (Signed)
[facility] [address]  [place], [place], [address] Phone: [phone]   Fax:  [phone]  Physical Therapy Treatment  Patient Details  Name: Wrigley Plasencia MRN: [id number] Date of Birth: [birth year] Referring Provider: Julian Reil, MD  Encounter Date: 05/26/2016      PT End of Session - 05/26/16 1019    Visit Number 17   Number of Visits 24   Date for PT Re-Evaluation 06/18/16   PT Start Time 1019   PT Stop Time 1100   PT Time Calculation (min) 41 min   Activity Tolerance Patient tolerated treatment well   Behavior During Therapy [facility] for tasks assessed/performed      Past Medical History:  Diagnosis Date  . Aortic stenosis   . Atrial fibrillation ([place])   . BPH (benign prostatic hyperplasia)   . Dysrhythmia    afib  . GERD (gastroesophageal reflux disease)   . Heart murmur   . History of kidney stones   . History of left foot drop 01/02/2014  . Hypertension   . Lumbar disc disease   . Mixed hyperlipidemia 09/27/2013   Does not want to take statins   . 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 CERVICAL LAMINECTOMY  2009  . EYE SURGERY Bilateral 09  . INTRAOPERATIVE TRANSESOPHAGEAL ECHOCARDIOGRAM N/A 06/04/2014   Procedure: INTRAOPERATIVE TRANSESOPHAGEAL ECHOCARDIOGRAM;  Surgeon: [name], MD;  Location: [facility] OR;  Service: Open Heart Surgery;  Laterality: N/A;  . LEFT AND RIGHT HEART CATHETERIZATION WITH CORONARY ANGIOGRAM N/A 04/22/2014   Procedure: LEFT AND RIGHT HEART CATHETERIZATION WITH CORONARY ANGIOGRAM;  Surgeon: Blane Ohara, MD;  Location: [facility] CATH LAB;  Service: Cardiovascular;  Laterality: N/A;  . LUMBAR LAMINECTOMY     29 yrs ago  . SHOULDER SURGERY Right 11  . skull surgery  2013   "plate on right side of  head"  . TOTAL KNEE ARTHROPLASTY Bilateral [id number]  . TRANSCATHETER AORTIC VALVE REPLACEMENT, TRANSFEMORAL N/A 06/04/2014   Procedure: TRANSCATHETER AORTIC VALVE REPLACEMENT, TRANSFEMORAL;  Surgeon: [name], MD;  Location: [facility];  Service: Open Heart Surgery;  Laterality: N/A;    There were no vitals filed for this visit.      Subjective Assessment - 05/26/16 1019    Subjective Pt arrived to PT stating that he had made a decision to stop PT at present and try working on balance HEP on his own. Requested review of most relevant HEP activities to continue with for HEP.   Patient Stated Goals Improve walking by 50%   Currently in Pain? No/denies            [facility] PT Assessment - 05/26/16 1100      Assessment   Medical Diagnosis Gait difficulty   Referring Provider Julian Reil, MD   Onset Date/Surgical Date --  August 2016   Next MD Visit 05/27/16     Observation/Other Assessments   Focus on Therapeutic Outcomes (FOTO)  57% (43% limitation)           TODAY'S TREATMENT  TherEx NuStep - lvl 7 x 7'  Neuro Corner static standing balance with back of chair in front of pt (occasional use of wall necessary with some activities with 2 seated rest breaks d/t fatigue):  Narrow BOS -  Eyes open/closed        Horizontal head movements        Trunk rotation        Vertical head movements        Marching in place  Rhomberg stance -       Eyes open/closed       Horizontal head movements   Semi-tandem stance - Alt fwd foot       Eyes open/closed       [place]  Tandem stance - Alt fwd foot       Eyes open  R/L SLS Standing against wall - Hip peel off 2x10 At side of treadmill:    Sidestepping with intermittent UE support    Walking backward with intermittent single UE support Sit <> Stand w/o UE assist x5 At counter top:    B Sidestepping x1; hands hovering over counter    B Sidestepping with yellow TB around ankles x1; light UE support on  counter    Retro gait with yellow TB - alt step-to pattern x1, step-through x1; single UE support on counter    B 3 way SLR (flexion, abduction, extension) with looped yellow TB    B Alt 3 way SLR while standing on compliant surface    Heel-toe weight shift on compliant surface; single UE support on counter    Marching on compliant surface with single UE support on counter              PT Short Term Goals - 05/19/16 1105      PT SHORT TERM GOAL #1   Title Pt will be independent with initial cervical HEP by 03/23/16   Status Achieved     PT SHORT TERM GOAL #2   Title Pt will demonstrate awareness of appropriate placement and sequencing of cane with gait by 03/23/16   Status Achieved           PT Long Term Goals - 05/26/16 1100      PT LONG TERM GOAL #1   Title Pt will be independent with advanced HEP as indicated by 05/21/16   Status Achieved  Achieved for neck HEP     PT LONG TERM GOAL #2   Title Pt will consistently demonstrate normal gait pattern with cane during gait by 04/30/16   Status Achieved     PT LONG TERM GOAL #3   Title Pt will improve Berg Balance Scale score to >/= 28/56 to reduce risk for falls by 05/21/16   Status Achieved  Currently 34/56     PT LONG TERM GOAL #4   Title Pt will report improved ability to turn head while driving by 5/70/17   Status Achieved     PT LONG TERM GOAL #5   Title Pt will improve Berg Balance Scale score to >/= 37/56 to reduce risk for falls by 06/18/16   Status Not Met  Only 1 visit since recert when goal established     PT LONG TERM GOAL #6   Title Pt will report ability to stand at the sink for 5 minutes w/o leaning into sink to maintain balance by 06/18/16   Status Not Met  Only 1 visit since recert when goal established     PT LONG TERM GOAL #7   Title Independent with [place] fall prevention program by 06/18/16   Status Achieved               Plan - 05/26/16 1100  Clinical Impression Statement Pt arrived to PT  stating that he had changed his mind about continuing with PT and would rather try working on his own with his balance HEP & [place] fall prevention program but would like final review of each to identify areas that he should focus on while completing home program. Provided overview of most relevant corner balance & [place] activities to address areas of continued deficits and provided education on self-progression of activities as they become easier. Will proceed with discharge per pt's request.   Rehab Potential Good   Clinical Impairments Affecting Rehab Potential Lumbar DDD with radiculopathy & L foot drop requiring L AFO, chronic afib, severe aortic stenosis   PT Frequency 2x / week   PT Duration 4 weeks   PT Treatment/Interventions Patient/family education;Gait training;DME Instruction;Neuromuscular re-education;Balance training;Therapeutic activities;Functional mobility training;Therapeutic exercise;Manual techniques;Passive range of motion;Ultrasound;Electrical Stimulation;Moist Heat;Cryotherapy;Traction   PT Next Visit Plan Discharge   Consulted and Agree with Plan of Care Patient      Patient will benefit from skilled therapeutic intervention in order to improve the following deficits and impairments:  Decreased balance, Decreased coordination, Abnormal gait, Difficulty walking, Decreased strength, Decreased safety awareness, Decreased knowledge of use of DME, Decreased range of motion, Impaired flexibility, Pain, Postural dysfunction, Improper body mechanics  Visit Diagnosis: Other abnormalities of gait and mobility  Unsteadiness on feet       G-Codes - 2016/05/30 1100    Functional Assessment Tool Used Berg = 33/56 (41.1% Impaired) + clinical judgement   Functional Limitation Mobility: Walking and moving around   Mobility: Walking and Moving Around Goal Status [id number]) At least 40 percent but less than 60 percent impaired, limited or restricted   Mobility: Walking and Moving Around  Discharge Status [id number]) At least 40 percent but less than 60 percent impaired, limited or restricted      Problem List Patient Active Problem List   Diagnosis Date Noted  . Post concussion syndrome 07/23/2015  . Gait difficulty 07/23/2015  . Lumbar radiculopathy 07/23/2015  . Aortic valve disorders 06/04/2014  . 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 Severe aortic stenosis 04/24/2014  . Aortic stenosis, severe 04/04/2014  . Long-term (current) use of anticoagulants   . History of left foot drop 01/02/2014  . Leg length discrepancy 01/02/2014  . Mixed hyperlipidemia 09/27/2013  . Atrial fibrillation, permanent ([place])     Percival Spanish, PT, MPT 05-30-2016, 1:30 PM  [facility] [address]  [place], [place], [address] Phone: [phone]   Fax:  [phone]  Name: Kelechi Orgeron MRN: [id number] Date of Birth: [birth year]   PHYSICAL THERAPY DISCHARGE SUMMARY  Visits from Start of Care: 17  Current functional level related to goals / functional outcomes:    Refer to above clinical impression   Remaining deficits:    As above. Pt to continue with home balance [place] fall prevention program.   Education / Equipment:    HEP & [place] fall prevention program.  Plan: Patient agrees to discharge.  Patient goals were partially met. Patient is being discharged due to the patient's request.  ?????     Percival Spanish, PT, MPT May 30, 2016, 1:39 PM  [facility] [address]  [place], [place], [address] Phone: [phone]   Fax:  [phone]

## 2016-05-31 DIAGNOSIS — I480 Paroxysmal atrial fibrillation: Secondary | ICD-10-CM | POA: Diagnosis not present

## 2016-05-31 DIAGNOSIS — Z7901 Long term (current) use of anticoagulants: Secondary | ICD-10-CM | POA: Diagnosis not present

## 2016-05-31 DIAGNOSIS — K219 Gastro-esophageal reflux disease without esophagitis: Secondary | ICD-10-CM | POA: Diagnosis not present

## 2016-05-31 DIAGNOSIS — R0602 Shortness of breath: Secondary | ICD-10-CM | POA: Diagnosis not present

## 2016-05-31 DIAGNOSIS — Z952 Presence of prosthetic heart valve: Secondary | ICD-10-CM | POA: Diagnosis not present

## 2016-05-31 DIAGNOSIS — I350 Nonrheumatic aortic (valve) stenosis: Secondary | ICD-10-CM | POA: Diagnosis not present

## 2016-06-16 DIAGNOSIS — M7020 Olecranon bursitis, unspecified elbow: Secondary | ICD-10-CM | POA: Diagnosis not present

## 2016-06-16 DIAGNOSIS — M7022 Olecranon bursitis, left elbow: Secondary | ICD-10-CM | POA: Diagnosis not present

## 2016-06-18 DIAGNOSIS — Z7901 Long term (current) use of anticoagulants: Secondary | ICD-10-CM | POA: Diagnosis not present

## 2016-06-18 DIAGNOSIS — M7022 Olecranon bursitis, left elbow: Secondary | ICD-10-CM | POA: Diagnosis not present

## 2016-06-24 DIAGNOSIS — M7020 Olecranon bursitis, unspecified elbow: Secondary | ICD-10-CM | POA: Diagnosis not present

## 2016-06-24 DIAGNOSIS — M1990 Unspecified osteoarthritis, unspecified site: Secondary | ICD-10-CM | POA: Diagnosis not present

## 2016-07-16 DIAGNOSIS — I482 Chronic atrial fibrillation: Secondary | ICD-10-CM | POA: Diagnosis not present

## 2016-07-16 DIAGNOSIS — M25422 Effusion, left elbow: Secondary | ICD-10-CM | POA: Diagnosis not present

## 2016-07-23 DIAGNOSIS — H43813 Vitreous degeneration, bilateral: Secondary | ICD-10-CM | POA: Diagnosis not present

## 2016-07-23 DIAGNOSIS — H353211 Exudative age-related macular degeneration, right eye, with active choroidal neovascularization: Secondary | ICD-10-CM | POA: Diagnosis not present

## 2016-07-23 DIAGNOSIS — H353122 Nonexudative age-related macular degeneration, left eye, intermediate dry stage: Secondary | ICD-10-CM | POA: Diagnosis not present

## 2016-07-23 DIAGNOSIS — D3132 Benign neoplasm of left choroid: Secondary | ICD-10-CM | POA: Diagnosis not present

## 2016-07-28 DIAGNOSIS — R000 Tachycardia, unspecified: Secondary | ICD-10-CM | POA: Diagnosis not present

## 2016-07-28 DIAGNOSIS — R197 Diarrhea, unspecified: Secondary | ICD-10-CM | POA: Diagnosis not present

## 2016-07-28 DIAGNOSIS — I482 Chronic atrial fibrillation: Secondary | ICD-10-CM | POA: Diagnosis not present

## 2016-07-28 DIAGNOSIS — Z7901 Long term (current) use of anticoagulants: Secondary | ICD-10-CM | POA: Diagnosis not present

## 2016-07-31 DIAGNOSIS — I482 Chronic atrial fibrillation: Secondary | ICD-10-CM | POA: Diagnosis not present

## 2016-08-05 IMAGING — CR DG CHEST 1V PORT
1 series · 1 of 1 positions shown · non-contrast
Comparison: PA and lateral chest of May 31, 2014

CLINICAL DATA: Status post aortic valve replacement

EXAM:
PORTABLE CHEST - 1 VIEW

[AP]
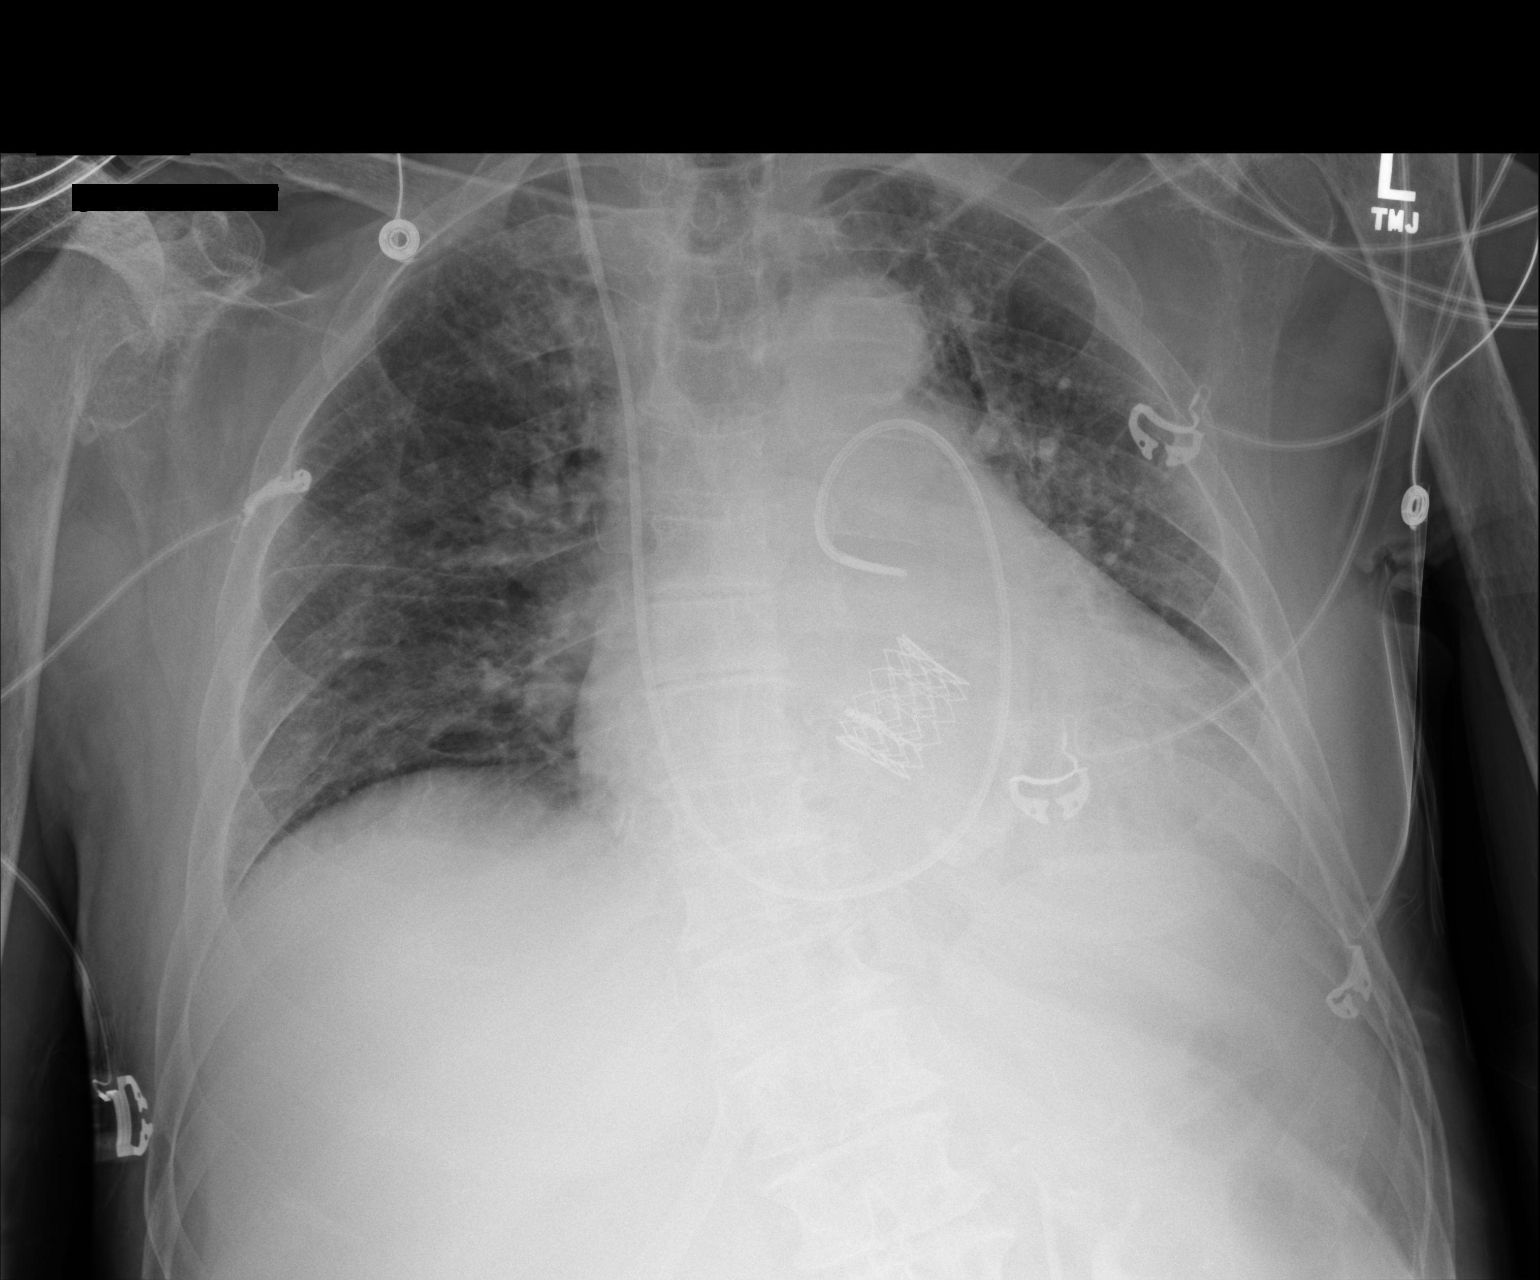

[1 of 1 positions shown; findings below may reference images not displayed]

FINDINGS: The lungs are mildly hypoinflated. There is no focal infiltrate nor
significant atelectasis. There is no pneumothorax. The
cardiopericardial silhouette is enlarged. The aortic valve cage
visible. The pulmonary interstitial markings are mildly increased. DABOSKI
G[place]y type catheter tip lies in the region of the proximal left
main pulmonary artery.
IMPRESSION: Mild hypoinflation with low-grade CHF. There is no pneumothorax,
significant atelectasis, or pleural effusion.

## 2016-08-10 DIAGNOSIS — Z7901 Long term (current) use of anticoagulants: Secondary | ICD-10-CM | POA: Diagnosis not present

## 2016-08-10 DIAGNOSIS — I480 Paroxysmal atrial fibrillation: Secondary | ICD-10-CM | POA: Diagnosis not present

## 2016-08-10 DIAGNOSIS — R0602 Shortness of breath: Secondary | ICD-10-CM | POA: Diagnosis not present

## 2016-08-10 DIAGNOSIS — I350 Nonrheumatic aortic (valve) stenosis: Secondary | ICD-10-CM | POA: Diagnosis not present

## 2016-08-10 DIAGNOSIS — Z952 Presence of prosthetic heart valve: Secondary | ICD-10-CM | POA: Diagnosis not present

## 2016-08-10 DIAGNOSIS — K219 Gastro-esophageal reflux disease without esophagitis: Secondary | ICD-10-CM | POA: Diagnosis not present

## 2016-08-13 DIAGNOSIS — I482 Chronic atrial fibrillation: Secondary | ICD-10-CM | POA: Diagnosis not present

## 2016-08-13 DIAGNOSIS — Z7901 Long term (current) use of anticoagulants: Secondary | ICD-10-CM | POA: Diagnosis not present

## 2016-08-20 DIAGNOSIS — M7022 Olecranon bursitis, left elbow: Secondary | ICD-10-CM | POA: Diagnosis not present

## 2016-08-20 DIAGNOSIS — M2550 Pain in unspecified joint: Secondary | ICD-10-CM | POA: Diagnosis not present

## 2016-08-31 DIAGNOSIS — M7022 Olecranon bursitis, left elbow: Secondary | ICD-10-CM | POA: Diagnosis not present

## 2016-09-08 DIAGNOSIS — M7022 Olecranon bursitis, left elbow: Secondary | ICD-10-CM | POA: Diagnosis not present

## 2016-09-27 DIAGNOSIS — R0981 Nasal congestion: Secondary | ICD-10-CM | POA: Diagnosis not present

## 2016-09-27 DIAGNOSIS — J329 Chronic sinusitis, unspecified: Secondary | ICD-10-CM | POA: Diagnosis not present

## 2016-09-27 DIAGNOSIS — M6530 Trigger finger, unspecified finger: Secondary | ICD-10-CM | POA: Diagnosis not present

## 2016-09-27 DIAGNOSIS — R2681 Unsteadiness on feet: Secondary | ICD-10-CM | POA: Diagnosis not present

## 2016-10-01 DIAGNOSIS — M65341 Trigger finger, right ring finger: Secondary | ICD-10-CM | POA: Diagnosis not present

## 2016-10-01 DIAGNOSIS — M25522 Pain in left elbow: Secondary | ICD-10-CM | POA: Diagnosis not present

## 2016-10-01 DIAGNOSIS — M7022 Olecranon bursitis, left elbow: Secondary | ICD-10-CM | POA: Diagnosis not present

## 2016-10-08 DIAGNOSIS — H353122 Nonexudative age-related macular degeneration, left eye, intermediate dry stage: Secondary | ICD-10-CM | POA: Diagnosis not present

## 2016-10-08 DIAGNOSIS — D3132 Benign neoplasm of left choroid: Secondary | ICD-10-CM | POA: Diagnosis not present

## 2016-10-08 DIAGNOSIS — H353211 Exudative age-related macular degeneration, right eye, with active choroidal neovascularization: Secondary | ICD-10-CM | POA: Diagnosis not present

## 2016-10-08 DIAGNOSIS — H43813 Vitreous degeneration, bilateral: Secondary | ICD-10-CM | POA: Diagnosis not present

## 2016-10-20 DIAGNOSIS — M25522 Pain in left elbow: Secondary | ICD-10-CM | POA: Diagnosis not present

## 2016-10-20 DIAGNOSIS — M65341 Trigger finger, right ring finger: Secondary | ICD-10-CM | POA: Diagnosis not present

## 2016-10-25 ENCOUNTER — Ambulatory Visit: Payer: Medicare Other | Admitting: Neurology

## 2016-11-02 DIAGNOSIS — R609 Edema, unspecified: Secondary | ICD-10-CM | POA: Diagnosis not present

## 2016-11-02 DIAGNOSIS — Z7901 Long term (current) use of anticoagulants: Secondary | ICD-10-CM | POA: Diagnosis not present

## 2016-11-30 DIAGNOSIS — I482 Chronic atrial fibrillation: Secondary | ICD-10-CM | POA: Diagnosis not present

## 2016-12-20 DIAGNOSIS — Z7901 Long term (current) use of anticoagulants: Secondary | ICD-10-CM | POA: Diagnosis not present

## 2016-12-20 DIAGNOSIS — R040 Epistaxis: Secondary | ICD-10-CM | POA: Diagnosis not present

## 2016-12-24 DIAGNOSIS — R05 Cough: Secondary | ICD-10-CM | POA: Diagnosis not present

## 2016-12-30 ENCOUNTER — Emergency Department (HOSPITAL_BASED_OUTPATIENT_CLINIC_OR_DEPARTMENT_OTHER)
Admission: EM | Admit: 2016-12-30 | Discharge: 2016-12-30 | Disposition: A | Discharge status: Home / Self Care | Payer: Medicare HMO | Attending: Emergency Medicine | Admitting: Emergency Medicine

## 2016-12-30 ENCOUNTER — Encounter (HOSPITAL_BASED_OUTPATIENT_CLINIC_OR_DEPARTMENT_OTHER): Payer: Self-pay

## 2016-12-30 ENCOUNTER — Emergency Department (HOSPITAL_BASED_OUTPATIENT_CLINIC_OR_DEPARTMENT_OTHER): Payer: Medicare HMO

## 2016-12-30 DIAGNOSIS — R05 Cough: Secondary | ICD-10-CM | POA: Diagnosis not present

## 2016-12-30 DIAGNOSIS — Z79899 Other long term (current) drug therapy: Secondary | ICD-10-CM | POA: Diagnosis not present

## 2016-12-30 DIAGNOSIS — J40 Bronchitis, not specified as acute or chronic: Secondary | ICD-10-CM

## 2016-12-30 DIAGNOSIS — J069 Acute upper respiratory infection, unspecified: Secondary | ICD-10-CM | POA: Diagnosis not present

## 2016-12-30 DIAGNOSIS — I10 Essential (primary) hypertension: Secondary | ICD-10-CM | POA: Insufficient documentation

## 2016-12-30 DIAGNOSIS — B9789 Other viral agents as the cause of diseases classified elsewhere: Secondary | ICD-10-CM

## 2016-12-30 DIAGNOSIS — I2510 Atherosclerotic heart disease of native coronary artery without angina pectoris: Secondary | ICD-10-CM | POA: Insufficient documentation

## 2016-12-30 HISTORY — DX: Atherosclerotic heart disease of native coronary artery without angina pectoris: I25.10

## 2016-12-30 LAB — COMPREHENSIVE METABOLIC PANEL
ALK PHOS: 87 U/L (ref 38–126)
ALT: 13 U/L — ABNORMAL LOW (ref 17–63)
AST: 28 U/L (ref 15–41)
Albumin: 3.4 g/dL — ABNORMAL LOW (ref 3.5–5.0)
Anion gap: 7 (ref 5–15)
BILIRUBIN TOTAL: 0.6 mg/dL (ref 0.3–1.2)
BUN: 18 mg/dL (ref 6–20)
CALCIUM: 8.2 mg/dL — AB (ref 8.9–10.3)
CO2: 24 mmol/L (ref 22–32)
CREATININE: 1 mg/dL (ref 0.61–1.24)
Chloride: 102 mmol/L (ref 101–111)
GFR calc non Af Amer: >60 mL/min (ref >60)
Glucose, Bld: 100 mg/dL — ABNORMAL HIGH (ref 65–99)
Potassium: 3.8 mmol/L (ref 3.5–5.1)
SODIUM: 133 mmol/L — AB (ref 135–145)
TOTAL PROTEIN: 6.6 g/dL (ref 6.5–8.1)

## 2016-12-30 LAB — CBC WITH DIFFERENTIAL/PLATELET
BASOS ABS: 0 10*3/uL (ref 0.0–0.1)
BASOS PCT: 0 %
EOS ABS: 0 10*3/uL (ref 0.0–0.7)
Eosinophils Relative: 1 %
HCT: 40.2 % (ref 39.0–52.0)
Hemoglobin: 13.7 g/dL (ref 13.0–17.0)
Lymphocytes Relative: 22 %
Lymphs Abs: 0.8 10*3/uL (ref 0.7–4.0)
MCH: 30.1 pg (ref 26.0–34.0)
MCHC: 34.1 g/dL (ref 30.0–36.0)
MCV: 88.4 fL (ref 78.0–100.0)
Monocytes Absolute: 0.5 10*3/uL (ref 0.1–1.0)
Monocytes Relative: 15 %
Neutro Abs: 2.2 10*3/uL (ref 1.7–7.7)
Neutrophils Relative %: 62 %
Platelets: 199 10*3/uL (ref 150–400)
RBC: 4.55 MIL/uL (ref 4.22–5.81)
RDW: 15.1 % (ref 11.5–15.5)
WBC: 3.5 10*3/uL — AB (ref 4.0–10.5)

## 2016-12-30 LAB — DIGOXIN LEVEL: DIGOXIN LVL: 1.2 ng/mL (ref 0.8–2.0)

## 2016-12-30 LAB — I-STAT CG4 LACTIC ACID, ED: Lactic Acid, Venous: 1.32 mmol/L (ref 0.5–1.9)

## 2016-12-30 LAB — PROTIME-INR
INR: 2.07
Prothrombin Time: 23.6 seconds — ABNORMAL HIGH (ref 11.4–15.2)

## 2016-12-30 MED ORDER — IPRATROPIUM-ALBUTEROL 0.5-2.5 (3) MG/3ML IN SOLN
3.0000 mL | Freq: Four times a day (QID) | RESPIRATORY_TRACT | Status: DC
  Administered 2016-12-30: 3 mL via RESPIRATORY_TRACT
  Filled 2016-12-30: qty 3

## 2016-12-30 MED ORDER — AEROCHAMBER PLUS W/MASK MISC
1.0000 | Freq: Once | Status: AC
  Administered 2016-12-30: 1
  Filled 2016-12-30: qty 1

## 2016-12-30 MED ORDER — SODIUM CHLORIDE 0.9 % IV BOLUS (SEPSIS)
500.0000 mL | Freq: Once | INTRAVENOUS | Status: AC
  Administered 2016-12-30: 500 mL via INTRAVENOUS

## 2016-12-30 MED ORDER — ALBUTEROL SULFATE HFA 108 (90 BASE) MCG/ACT IN AERS
2.0000 | INHALATION_SPRAY | Freq: Four times a day (QID) | RESPIRATORY_TRACT | Status: DC | PRN
  Administered 2016-12-30: 2 via RESPIRATORY_TRACT
  Filled 2016-12-30: qty 6.7

## 2016-12-30 MED ORDER — DEXAMETHASONE SODIUM PHOSPHATE 10 MG/ML IJ SOLN
10.0000 mg | Freq: Once | INTRAMUSCULAR | Status: AC
  Administered 2016-12-30: 10 mg via INTRAVENOUS
  Filled 2016-12-30: qty 1

## 2016-12-30 MED ORDER — BENZONATATE 100 MG PO CAPS: 200.0000 mg | ORAL_CAPSULE | Freq: Two times a day (BID) | ORAL | 0 refills | Status: AC | PRN

## 2016-12-30 MED FILL — BENZONATATE 100 MG CAP: 100 | 4 days supply | Qty: 16 | Fill #0

## 2016-12-30 NOTE — ED Notes (Signed)
Patient transported to X-ray 

## 2016-12-30 NOTE — ED Notes (Signed)
ED Provider at bedside. 

## 2016-12-30 NOTE — ED Triage Notes (Signed)
Pt with cough x 6 days-was seen by PCP last week-was given abx to start next day-now having diarrhea x 4 days-PCP advised to stop abx-pt feels he may be dehydrated-NAD-slow steady gait with own walker

## 2016-12-30 NOTE — ED Provider Notes (Signed)
[facility] DEPT Provider Note   CSN: [id number] Arrival date & time: 12/30/16 1422     History    Chief Complaint  Patient presents with  . Cough     HPI John [name] is a 83 y.o. male.  83yo M w/ PMH below including A fib on coumadin, GERD, TAVR, HTN who p/w cough and peeling skin. Approximately one week ago, he developed a severe cough. He was seen by his PCP 6 days ago and told that his lungs were clear but was given a prescription for amoxicillin and instructed to start this prescription if his cough worsened. Wife reports his cough became much worse and so they initiated amoxicillin 5 days ago. The following day he began having diarrhea and because of 4 days of diarrhea he stopped the antibiotic 2 days ago. He has continued to have mild diarrhea since then but states that his cough has been improving. He reports that it is productive of clear phlegm. He denies any associated shortness of breath or chest pain. No vomiting or fevers. He reports a decreased appetite and generalized weakness. Good urine output. The reason he presents today is because his skin was peeling on his legs and he was concerned that he was dehydrated. His wife does note that they have been in [place] for the past 6 weeks and he has been out in the sun without sunscreen a lot.   Past Medical History:  Diagnosis Date  . Aortic stenosis   . Atrial fibrillation ([place])   . BPH (benign prostatic hyperplasia)   . Coronary artery disease   . Dysrhythmia    afib  . GERD (gastroesophageal reflux disease)   . Heart murmur   . History of kidney stones   . History of left foot drop 01/02/2014  . Hypertension   . Lumbar disc disease   . Mixed hyperlipidemia 09/27/2013   Does not want to take statins   . 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 Post concussion syndrome 07/23/2015  . Gait difficulty 07/23/2015  . Lumbar radiculopathy 07/23/2015  . Aortic valve disorders 06/04/2014  . 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 Severe aortic stenosis 04/24/2014  . Aortic stenosis, severe 04/04/2014  . Long-term (current) use of anticoagulants   . History of left foot drop 01/02/2014  . Leg length discrepancy 01/02/2014  . Mixed hyperlipidemia 09/27/2013  . Atrial fibrillation, permanent [facility])     Past Surgical History:  Procedure Laterality Date  . CERVICAL LAMINECTOMY  2009  . EYE SURGERY Bilateral 09  . INTRAOPERATIVE TRANSESOPHAGEAL ECHOCARDIOGRAM N/A 06/04/2014   Procedure: INTRAOPERATIVE TRANSESOPHAGEAL ECHOCARDIOGRAM;  Surgeon: [name], MD;  Location: [facility] OR;  Service: Open Heart Surgery;  Laterality: N/A;  . LEFT AND RIGHT HEART CATHETERIZATION WITH CORONARY ANGIOGRAM N/A 04/22/2014   Procedure: LEFT AND RIGHT HEART CATHETERIZATION WITH CORONARY ANGIOGRAM;  Surgeon: Blane Ohara, MD;  Location: [facility] CATH LAB;  Service: Cardiovascular;  Laterality: N/A;  . LUMBAR LAMINECTOMY     29 yrs ago  . SHOULDER SURGERY Right 11  . skull surgery  2013   "plate on right side of head"  . TOTAL KNEE ARTHROPLASTY Bilateral [id number]  . TRANSCATHETER AORTIC VALVE REPLACEMENT, TRANSFEMORAL N/A 06/04/2014   Procedure: TRANSCATHETER AORTIC VALVE REPLACEMENT, TRANSFEMORAL;  Surgeon: [name], MD;  Location: [facility] OR;  Service: Open Heart Surgery;  Laterality: N/A;        Home Medications    Prior to Admission medications   Medication Sig Start Date End Date Taking? Authorizing Provider  amiodarone (PACERONE) 100 MG tablet Take 100 mg by mouth daily.    Historical Provider, MD  ascorbic Acid (VITAMIN C) 500 MG CPCR Take 500 mg by mouth 2 (two) times daily.    Historical Provider, MD  b complex vitamins tablet Take 1 tablet by mouth daily.    Historical Provider, MD  BEE POLLEN PO Take 1,740 mg  by mouth daily.    Historical Provider, MD  benzonatate (TESSALON) 100 MG capsule Take 2 capsules (200 mg total) by mouth 2 (two) times daily as needed for cough. 12/30/16   Sharlett Iles, MD  Cholecalciferol (VITAMIN D3) 5000 UNITS TABS Take 1 tablet by mouth daily.    Historical Provider, MD  digoxin (LANOXIN) 0.125 MG tablet Take 0.125 mg by mouth daily. Reported on 03/02/2016    Historical Provider, MD  finasteride (PROSCAR) 5 MG tablet Take 5 mg by mouth daily.  01/15/14   Historical Provider, MD  MAGNESIUM PO Take 1 tablet by mouth daily.    Historical Provider, MD  Multiple Vitamin (MULTIVITAMIN WITH MINERALS) TABS Take 1 tablet by mouth daily.    Historical Provider, MD  Pantothenic Acid 500 MG TABS Take 500 mg by mouth daily.    Historical Provider, MD  Potassium 99 MG TABS Take 99 mg by mouth daily.    Historical Provider, MD  vitamin B-12 (CYANOCOBALAMIN) 1000 MCG tablet Take 1,000 mcg by mouth daily.    Historical Provider, MD  warfarin (COUMADIN) 5 MG tablet Take 5-7.5 mg by mouth daily. Takes 7.5mg  on mon and fri  Takes 5mg  al other days    Historical Provider, MD  Zinc 50 MG TABS Take 50 mg by mouth daily.    Historical Provider, MD      Family History  Problem Relation Age of Onset  . Hypertension Father 9     Social History  Substance Use Topics  . Smoking status: Never Smoker  . Smokeless tobacco: Never Used  . Alcohol use Yes     Comment: occ     Allergies     Percocet [oxycodone-acetaminophen]    Review of Systems  10 Systems reviewed and are negative for acute change except as noted in the HPI.   Physical Exam Updated Vital Signs BP 110/74 (BP Location: Left Arm)   Pulse 69   Temp 97.6 F (36.4 C) (Oral)   Resp (!) 22   Ht 5\' 9"  (1.753 m)   Wt 158 lb (71.7 kg)   SpO2 93% Comment: Best reading from two different fingers  BMI 23.33 kg/m   Physical Exam  Constitutional: He is oriented to person, place, and time. He appears  well-developed and well-nourished. No distress.  HENT:  Head: Normocephalic and atraumatic.  Mouth/Throat: Oropharynx is clear and moist.  Moist mucous membranes  Eyes: Conjunctivae are normal. Pupils are equal, round, and reactive to light.  Neck: Neck supple.  Cardiovascular: Normal rate and normal heart sounds.  An irregularly irregular rhythm present.  No murmur heard. Pulmonary/Chest: Effort normal. He has wheezes.  End- expiratory wheezes b/l, frequent cough, normal WOB  Abdominal: Soft. Bowel sounds are normal. He exhibits no distension. There is no tenderness.  Musculoskeletal: He exhibits no edema or tenderness.  Peeling skin b/l legs  Neurological: He  is alert and oriented to person, place, and time.  Fluent speech  Skin: Skin is [place] and dry.  Psychiatric: He has a normal mood and affect. Judgment normal.  Nursing note and vitals reviewed.     ED Treatments / Results  Labs (all labs ordered are listed, but only abnormal results are displayed) Labs Reviewed  COMPREHENSIVE METABOLIC PANEL - Abnormal; Notable for the following:       Result Value   Sodium 133 (*)    Glucose, Bld 100 (*)    Calcium 8.2 (*)    Albumin 3.4 (*)    ALT 13 (*)    All other components within normal limits  CBC WITH DIFFERENTIAL/PLATELET - Abnormal; Notable for the following:    WBC 3.5 (*)    All other components within normal limits  PROTIME-INR - Abnormal; Notable for the following:    Prothrombin Time 23.6 (*)    All other components within normal limits  DIGOXIN LEVEL  I-STAT CG4 LACTIC ACID, ED     EKG  EKG Interpretation  Date/Time:    Ventricular Rate:    PR Interval:    QRS Duration:   QT Interval:    QTC Calculation:   R Axis:     Text Interpretation:           Radiology Dg Chest 2 View  Result Date: 12/30/2016 CLINICAL DATA:  83 year old male with cough for 6 days and more recent onset diarrhea. Initial encounter. EXAM: CHEST  2 VIEW COMPARISON:  10/17/2015  and earlier. FINDINGS: Stable lung volumes. Stable cardiomegaly and mediastinal contours. Previous cardiac valve replacement. Stable tortuosity of the thoracic aorta. Chronic but mildly increased pulmonary interstitial markings more so on the right. At the same time the pulmonary vascularity appears normal. No pneumothorax or pleural effusion. No confluent pulmonary opacity. Scoliosis and degenerative changes in the spine. Previous cervical ACDF. Degenerative shoulder changes. No acute osseous abnormality identified. Negative visible bowel gas pattern. IMPRESSION: 1. Chronic but mildly increased pulmonary interstitial markings in the right lung. Consider viral/atypical respiratory infection. 2. No pleural effusion or other acute cardiopulmonary abnormality. Electronically Signed   By: Genevie Ann M.D.   On: 12/30/2016 16:32    Procedures Procedures (including critical care time) Procedures  Medications Ordered in ED  Medications  ipratropium-albuterol (DUONEB) 0.5-2.5 (3) MG/3ML nebulizer solution 3 mL (3 mLs Nebulization Given 12/30/16 1536)  albuterol (PROVENTIL HFA;VENTOLIN HFA) 108 (90 Base) MCG/ACT inhaler 2 puff (2 puffs Inhalation Given 12/30/16 1703)  aerochamber plus with mask device 1 each (not administered)  dexamethasone (DECADRON) injection 10 mg (not administered)  sodium chloride 0.9 % bolus 500 mL (500 mLs Intravenous New Bag/Given 12/30/16 1547)     Initial Impression / Assessment and Plan / ED Course  I have reviewed the triage vital signs and the nursing notes.  Pertinent labs & imaging results that were available during my care of the patient were reviewed by me and considered in my medical decision making (see chart for details).     PT w/ 1 week of cough, started amoxicillin but then later discontinued due to diarrhea. His cough has been improving but he is concerned about dehydration due to dry peeling skin on his legs. He was comfortable on exam, pleasant, and well  appearing. VS stable. O2 sat mid 90s on RA. Normal WOB, he did have end-expiratory wheezes b/l. I suspect his peeling skin is from sunburn. Gave duoneb. I suspect bronchitis from viral URI. Obtained screening labs to  evaluate for dehydration given his age and comorbidities.  Labwork shows normal lactate, normal creatinine, no evidence of dehydration or electrolyte arrangement, WBC 3.5 likely secondary to viral process. INR is therapeutic and digoxin level is normal. Chest x-ray suggestive of viral process, no infiltrates. This is consistent with his exam of wheezing suggestive of bronchitis. On reassessment after breathing treatment, he continues to have some expiratory wheezes but is breathing comfortably and denies any shortness of breath. I did give 1 dose of Decadron as he does not have a history of diabetes and he may find some improvement in the wheezing/cough. Provided with albuterol inhaler to use as needed if it helps his symptoms. Instructed to follow-up with PCP and provided with Ladona Ridgel to use at home. Also instructed to start taking lactobacillus for diarrhea. Return precautions reviewed and patient discharged in satisfactory condition.  Final Clinical Impressions(s) / ED Diagnoses   Final diagnoses:  Viral URI with cough  Bronchitis     New Prescriptions   BENZONATATE (TESSALON) 100 MG CAPSULE    Take 2 capsules (200 mg total) by mouth 2 (two) times daily as needed for cough.       Sharlett Iles, MD 12/30/16 [phone]

## 2016-12-31 DIAGNOSIS — D3132 Benign neoplasm of left choroid: Secondary | ICD-10-CM | POA: Diagnosis not present

## 2016-12-31 DIAGNOSIS — H353122 Nonexudative age-related macular degeneration, left eye, intermediate dry stage: Secondary | ICD-10-CM | POA: Diagnosis not present

## 2016-12-31 DIAGNOSIS — H353211 Exudative age-related macular degeneration, right eye, with active choroidal neovascularization: Secondary | ICD-10-CM | POA: Diagnosis not present

## 2016-12-31 DIAGNOSIS — H43813 Vitreous degeneration, bilateral: Secondary | ICD-10-CM | POA: Diagnosis not present

## 2017-01-10 DIAGNOSIS — K219 Gastro-esophageal reflux disease without esophagitis: Secondary | ICD-10-CM | POA: Diagnosis not present

## 2017-01-10 DIAGNOSIS — Z952 Presence of prosthetic heart valve: Secondary | ICD-10-CM | POA: Diagnosis not present

## 2017-01-10 DIAGNOSIS — I350 Nonrheumatic aortic (valve) stenosis: Secondary | ICD-10-CM | POA: Diagnosis not present

## 2017-01-10 DIAGNOSIS — I480 Paroxysmal atrial fibrillation: Secondary | ICD-10-CM | POA: Diagnosis not present

## 2017-01-10 DIAGNOSIS — R0602 Shortness of breath: Secondary | ICD-10-CM | POA: Diagnosis not present

## 2017-01-10 DIAGNOSIS — Z7901 Long term (current) use of anticoagulants: Secondary | ICD-10-CM | POA: Diagnosis not present

## 2017-02-18 DIAGNOSIS — K219 Gastro-esophageal reflux disease without esophagitis: Secondary | ICD-10-CM | POA: Diagnosis not present

## 2017-02-18 DIAGNOSIS — I4891 Unspecified atrial fibrillation: Secondary | ICD-10-CM | POA: Diagnosis not present

## 2017-02-18 DIAGNOSIS — N400 Enlarged prostate without lower urinary tract symptoms: Secondary | ICD-10-CM | POA: Diagnosis not present

## 2017-02-18 DIAGNOSIS — Z5181 Encounter for therapeutic drug level monitoring: Secondary | ICD-10-CM | POA: Diagnosis not present

## 2017-02-18 DIAGNOSIS — Z139 Encounter for screening, unspecified: Secondary | ICD-10-CM | POA: Diagnosis not present

## 2017-02-18 DIAGNOSIS — Z0000 Encounter for general adult medical examination without abnormal findings: Secondary | ICD-10-CM | POA: Diagnosis not present

## 2017-02-21 DIAGNOSIS — H5319 Other subjective visual disturbances: Secondary | ICD-10-CM | POA: Diagnosis not present

## 2017-02-21 DIAGNOSIS — H53453 Other localized visual field defect, bilateral: Secondary | ICD-10-CM | POA: Diagnosis not present

## 2017-03-03 DIAGNOSIS — K5901 Slow transit constipation: Secondary | ICD-10-CM | POA: Diagnosis not present

## 2017-03-03 DIAGNOSIS — I359 Nonrheumatic aortic valve disorder, unspecified: Secondary | ICD-10-CM | POA: Diagnosis not present

## 2017-03-03 DIAGNOSIS — I480 Paroxysmal atrial fibrillation: Secondary | ICD-10-CM | POA: Diagnosis not present

## 2017-03-03 DIAGNOSIS — Z6822 Body mass index (BMI) 22.0-22.9, adult: Secondary | ICD-10-CM | POA: Diagnosis not present

## 2017-03-03 DIAGNOSIS — K219 Gastro-esophageal reflux disease without esophagitis: Secondary | ICD-10-CM | POA: Diagnosis not present

## 2017-03-03 DIAGNOSIS — I4891 Unspecified atrial fibrillation: Secondary | ICD-10-CM | POA: Diagnosis not present

## 2017-03-03 DIAGNOSIS — Z79899 Other long term (current) drug therapy: Secondary | ICD-10-CM | POA: Diagnosis not present

## 2017-03-03 DIAGNOSIS — M1990 Unspecified osteoarthritis, unspecified site: Secondary | ICD-10-CM | POA: Diagnosis not present

## 2017-03-03 DIAGNOSIS — N400 Enlarged prostate without lower urinary tract symptoms: Secondary | ICD-10-CM | POA: Diagnosis not present

## 2017-03-07 DIAGNOSIS — M79671 Pain in right foot: Secondary | ICD-10-CM | POA: Diagnosis not present

## 2017-03-07 DIAGNOSIS — Z7901 Long term (current) use of anticoagulants: Secondary | ICD-10-CM | POA: Diagnosis not present

## 2017-03-07 DIAGNOSIS — I480 Paroxysmal atrial fibrillation: Secondary | ICD-10-CM | POA: Diagnosis not present

## 2017-03-07 DIAGNOSIS — R5383 Other fatigue: Secondary | ICD-10-CM | POA: Diagnosis not present

## 2017-03-07 DIAGNOSIS — Z6822 Body mass index (BMI) 22.0-22.9, adult: Secondary | ICD-10-CM | POA: Diagnosis not present

## 2017-03-09 DIAGNOSIS — I4891 Unspecified atrial fibrillation: Secondary | ICD-10-CM | POA: Diagnosis not present

## 2017-03-09 DIAGNOSIS — Z7901 Long term (current) use of anticoagulants: Secondary | ICD-10-CM | POA: Diagnosis not present

## 2017-03-16 DIAGNOSIS — R269 Unspecified abnormalities of gait and mobility: Secondary | ICD-10-CM | POA: Diagnosis not present

## 2017-03-16 DIAGNOSIS — R42 Dizziness and giddiness: Secondary | ICD-10-CM | POA: Diagnosis not present

## 2017-03-16 DIAGNOSIS — R531 Weakness: Secondary | ICD-10-CM | POA: Diagnosis not present

## 2017-03-21 DIAGNOSIS — H353212 Exudative age-related macular degeneration, right eye, with inactive choroidal neovascularization: Secondary | ICD-10-CM | POA: Diagnosis not present

## 2017-03-21 DIAGNOSIS — H43813 Vitreous degeneration, bilateral: Secondary | ICD-10-CM | POA: Diagnosis not present

## 2017-03-21 DIAGNOSIS — R269 Unspecified abnormalities of gait and mobility: Secondary | ICD-10-CM | POA: Diagnosis not present

## 2017-03-21 DIAGNOSIS — R531 Weakness: Secondary | ICD-10-CM | POA: Diagnosis not present

## 2017-03-21 DIAGNOSIS — R42 Dizziness and giddiness: Secondary | ICD-10-CM | POA: Diagnosis not present

## 2017-03-21 DIAGNOSIS — H353122 Nonexudative age-related macular degeneration, left eye, intermediate dry stage: Secondary | ICD-10-CM | POA: Diagnosis not present

## 2017-03-22 DIAGNOSIS — Z0101 Encounter for examination of eyes and vision with abnormal findings: Secondary | ICD-10-CM | POA: Diagnosis not present

## 2017-03-24 DIAGNOSIS — I482 Chronic atrial fibrillation: Secondary | ICD-10-CM | POA: Diagnosis not present

## 2017-03-24 DIAGNOSIS — D689 Coagulation defect, unspecified: Secondary | ICD-10-CM | POA: Diagnosis not present

## 2017-03-24 DIAGNOSIS — Z954 Presence of other heart-valve replacement: Secondary | ICD-10-CM | POA: Diagnosis not present

## 2017-03-28 DIAGNOSIS — R531 Weakness: Secondary | ICD-10-CM | POA: Diagnosis not present

## 2017-03-28 DIAGNOSIS — R42 Dizziness and giddiness: Secondary | ICD-10-CM | POA: Diagnosis not present

## 2017-03-28 DIAGNOSIS — R269 Unspecified abnormalities of gait and mobility: Secondary | ICD-10-CM | POA: Diagnosis not present

## 2017-03-29 DIAGNOSIS — H04123 Dry eye syndrome of bilateral lacrimal glands: Secondary | ICD-10-CM | POA: Diagnosis not present

## 2017-04-07 DIAGNOSIS — I4891 Unspecified atrial fibrillation: Secondary | ICD-10-CM | POA: Diagnosis not present

## 2017-04-07 DIAGNOSIS — I480 Paroxysmal atrial fibrillation: Secondary | ICD-10-CM | POA: Diagnosis not present

## 2017-04-07 DIAGNOSIS — Z7901 Long term (current) use of anticoagulants: Secondary | ICD-10-CM | POA: Diagnosis not present

## 2017-04-19 DIAGNOSIS — H04123 Dry eye syndrome of bilateral lacrimal glands: Secondary | ICD-10-CM | POA: Diagnosis not present

## 2017-04-19 DIAGNOSIS — H02835 Dermatochalasis of left lower eyelid: Secondary | ICD-10-CM | POA: Diagnosis not present

## 2017-04-19 DIAGNOSIS — H02832 Dermatochalasis of right lower eyelid: Secondary | ICD-10-CM | POA: Diagnosis not present

## 2017-04-22 DIAGNOSIS — I4891 Unspecified atrial fibrillation: Secondary | ICD-10-CM | POA: Diagnosis not present

## 2017-04-29 DIAGNOSIS — I4891 Unspecified atrial fibrillation: Secondary | ICD-10-CM | POA: Diagnosis not present

## 2017-05-02 DIAGNOSIS — M25572 Pain in left ankle and joints of left foot: Secondary | ICD-10-CM | POA: Diagnosis not present

## 2017-05-02 DIAGNOSIS — M25571 Pain in right ankle and joints of right foot: Secondary | ICD-10-CM | POA: Diagnosis not present

## 2017-05-05 DIAGNOSIS — I482 Chronic atrial fibrillation: Secondary | ICD-10-CM | POA: Diagnosis not present

## 2017-05-13 DIAGNOSIS — I4891 Unspecified atrial fibrillation: Secondary | ICD-10-CM | POA: Diagnosis not present

## 2017-05-13 DIAGNOSIS — Z7901 Long term (current) use of anticoagulants: Secondary | ICD-10-CM | POA: Diagnosis not present

## 2017-05-27 DIAGNOSIS — I4891 Unspecified atrial fibrillation: Secondary | ICD-10-CM | POA: Diagnosis not present

## 2017-05-27 DIAGNOSIS — Z7901 Long term (current) use of anticoagulants: Secondary | ICD-10-CM | POA: Diagnosis not present

## 2017-06-03 DIAGNOSIS — Z7901 Long term (current) use of anticoagulants: Secondary | ICD-10-CM | POA: Diagnosis not present

## 2017-06-23 DIAGNOSIS — D689 Coagulation defect, unspecified: Secondary | ICD-10-CM | POA: Diagnosis not present

## 2017-06-23 DIAGNOSIS — M12812 Other specific arthropathies, not elsewhere classified, left shoulder: Secondary | ICD-10-CM | POA: Diagnosis not present

## 2017-06-23 DIAGNOSIS — M25512 Pain in left shoulder: Secondary | ICD-10-CM | POA: Diagnosis not present

## 2017-06-23 DIAGNOSIS — Z954 Presence of other heart-valve replacement: Secondary | ICD-10-CM | POA: Diagnosis not present

## 2017-06-23 DIAGNOSIS — I482 Chronic atrial fibrillation: Secondary | ICD-10-CM | POA: Diagnosis not present

## 2017-06-27 DIAGNOSIS — H43813 Vitreous degeneration, bilateral: Secondary | ICD-10-CM | POA: Diagnosis not present

## 2017-06-27 DIAGNOSIS — H353122 Nonexudative age-related macular degeneration, left eye, intermediate dry stage: Secondary | ICD-10-CM | POA: Diagnosis not present

## 2017-06-27 DIAGNOSIS — H353212 Exudative age-related macular degeneration, right eye, with inactive choroidal neovascularization: Secondary | ICD-10-CM | POA: Diagnosis not present

## 2017-07-22 DIAGNOSIS — Z7901 Long term (current) use of anticoagulants: Secondary | ICD-10-CM | POA: Diagnosis not present

## 2017-07-22 DIAGNOSIS — I4891 Unspecified atrial fibrillation: Secondary | ICD-10-CM | POA: Diagnosis not present

## 2017-07-25 DIAGNOSIS — R270 Ataxia, unspecified: Secondary | ICD-10-CM | POA: Diagnosis not present

## 2017-07-25 DIAGNOSIS — I359 Nonrheumatic aortic valve disorder, unspecified: Secondary | ICD-10-CM | POA: Diagnosis not present

## 2017-07-25 DIAGNOSIS — Z6823 Body mass index (BMI) 23.0-23.9, adult: Secondary | ICD-10-CM | POA: Diagnosis not present

## 2017-07-25 DIAGNOSIS — Z79899 Other long term (current) drug therapy: Secondary | ICD-10-CM | POA: Diagnosis not present

## 2017-07-25 DIAGNOSIS — I4891 Unspecified atrial fibrillation: Secondary | ICD-10-CM | POA: Diagnosis not present

## 2017-07-25 DIAGNOSIS — E7800 Pure hypercholesterolemia, unspecified: Secondary | ICD-10-CM | POA: Diagnosis not present

## 2017-08-12 DIAGNOSIS — H532 Diplopia: Secondary | ICD-10-CM | POA: Diagnosis not present

## 2017-08-12 DIAGNOSIS — H5111 Convergence insufficiency: Secondary | ICD-10-CM | POA: Diagnosis not present

## 2017-08-12 DIAGNOSIS — H5052 Exophoria: Secondary | ICD-10-CM | POA: Diagnosis not present

## 2017-08-22 DIAGNOSIS — Z7901 Long term (current) use of anticoagulants: Secondary | ICD-10-CM | POA: Diagnosis not present

## 2017-08-22 DIAGNOSIS — I4891 Unspecified atrial fibrillation: Secondary | ICD-10-CM | POA: Diagnosis not present

## 2017-08-24 DIAGNOSIS — Z008 Encounter for other general examination: Secondary | ICD-10-CM | POA: Diagnosis not present

## 2017-09-19 DIAGNOSIS — H43813 Vitreous degeneration, bilateral: Secondary | ICD-10-CM | POA: Diagnosis not present

## 2017-09-19 DIAGNOSIS — H353122 Nonexudative age-related macular degeneration, left eye, intermediate dry stage: Secondary | ICD-10-CM | POA: Diagnosis not present

## 2017-09-19 DIAGNOSIS — H353212 Exudative age-related macular degeneration, right eye, with inactive choroidal neovascularization: Secondary | ICD-10-CM | POA: Diagnosis not present

## 2017-10-03 DIAGNOSIS — Z961 Presence of intraocular lens: Secondary | ICD-10-CM | POA: Diagnosis not present

## 2017-10-03 DIAGNOSIS — H02423 Myogenic ptosis of bilateral eyelids: Secondary | ICD-10-CM | POA: Diagnosis not present

## 2017-10-03 DIAGNOSIS — H26491 Other secondary cataract, right eye: Secondary | ICD-10-CM | POA: Diagnosis not present

## 2017-10-03 DIAGNOSIS — H02835 Dermatochalasis of left lower eyelid: Secondary | ICD-10-CM | POA: Diagnosis not present

## 2017-10-03 DIAGNOSIS — H01004 Unspecified blepharitis left upper eyelid: Secondary | ICD-10-CM | POA: Diagnosis not present

## 2017-10-03 DIAGNOSIS — H01001 Unspecified blepharitis right upper eyelid: Secondary | ICD-10-CM | POA: Diagnosis not present

## 2017-10-03 DIAGNOSIS — H01005 Unspecified blepharitis left lower eyelid: Secondary | ICD-10-CM | POA: Diagnosis not present

## 2017-10-03 DIAGNOSIS — H04123 Dry eye syndrome of bilateral lacrimal glands: Secondary | ICD-10-CM | POA: Diagnosis not present

## 2017-10-03 DIAGNOSIS — H02832 Dermatochalasis of right lower eyelid: Secondary | ICD-10-CM | POA: Diagnosis not present

## 2017-10-03 DIAGNOSIS — H01002 Unspecified blepharitis right lower eyelid: Secondary | ICD-10-CM | POA: Diagnosis not present

## 2017-10-04 DIAGNOSIS — Z7901 Long term (current) use of anticoagulants: Secondary | ICD-10-CM | POA: Diagnosis not present

## 2017-10-04 DIAGNOSIS — I4891 Unspecified atrial fibrillation: Secondary | ICD-10-CM | POA: Diagnosis not present

## 2017-10-14 DIAGNOSIS — R351 Nocturia: Secondary | ICD-10-CM | POA: Diagnosis not present

## 2017-10-14 DIAGNOSIS — N200 Calculus of kidney: Secondary | ICD-10-CM | POA: Diagnosis not present

## 2017-10-14 DIAGNOSIS — N401 Enlarged prostate with lower urinary tract symptoms: Secondary | ICD-10-CM | POA: Diagnosis not present

## 2017-10-14 DIAGNOSIS — N402 Nodular prostate without lower urinary tract symptoms: Secondary | ICD-10-CM | POA: Diagnosis not present

## 2017-12-18 IMAGING — CR DG CHEST 2V
2 series · 2 of 2 positions shown · non-contrast
Comparison: 06/05/2014

CLINICAL DATA: Shortness of breath with exertion for 3-4 months,
hypertension, atrial fibrillation, post TAVR

EXAM:
CHEST  2 VIEW

[w chest pa]
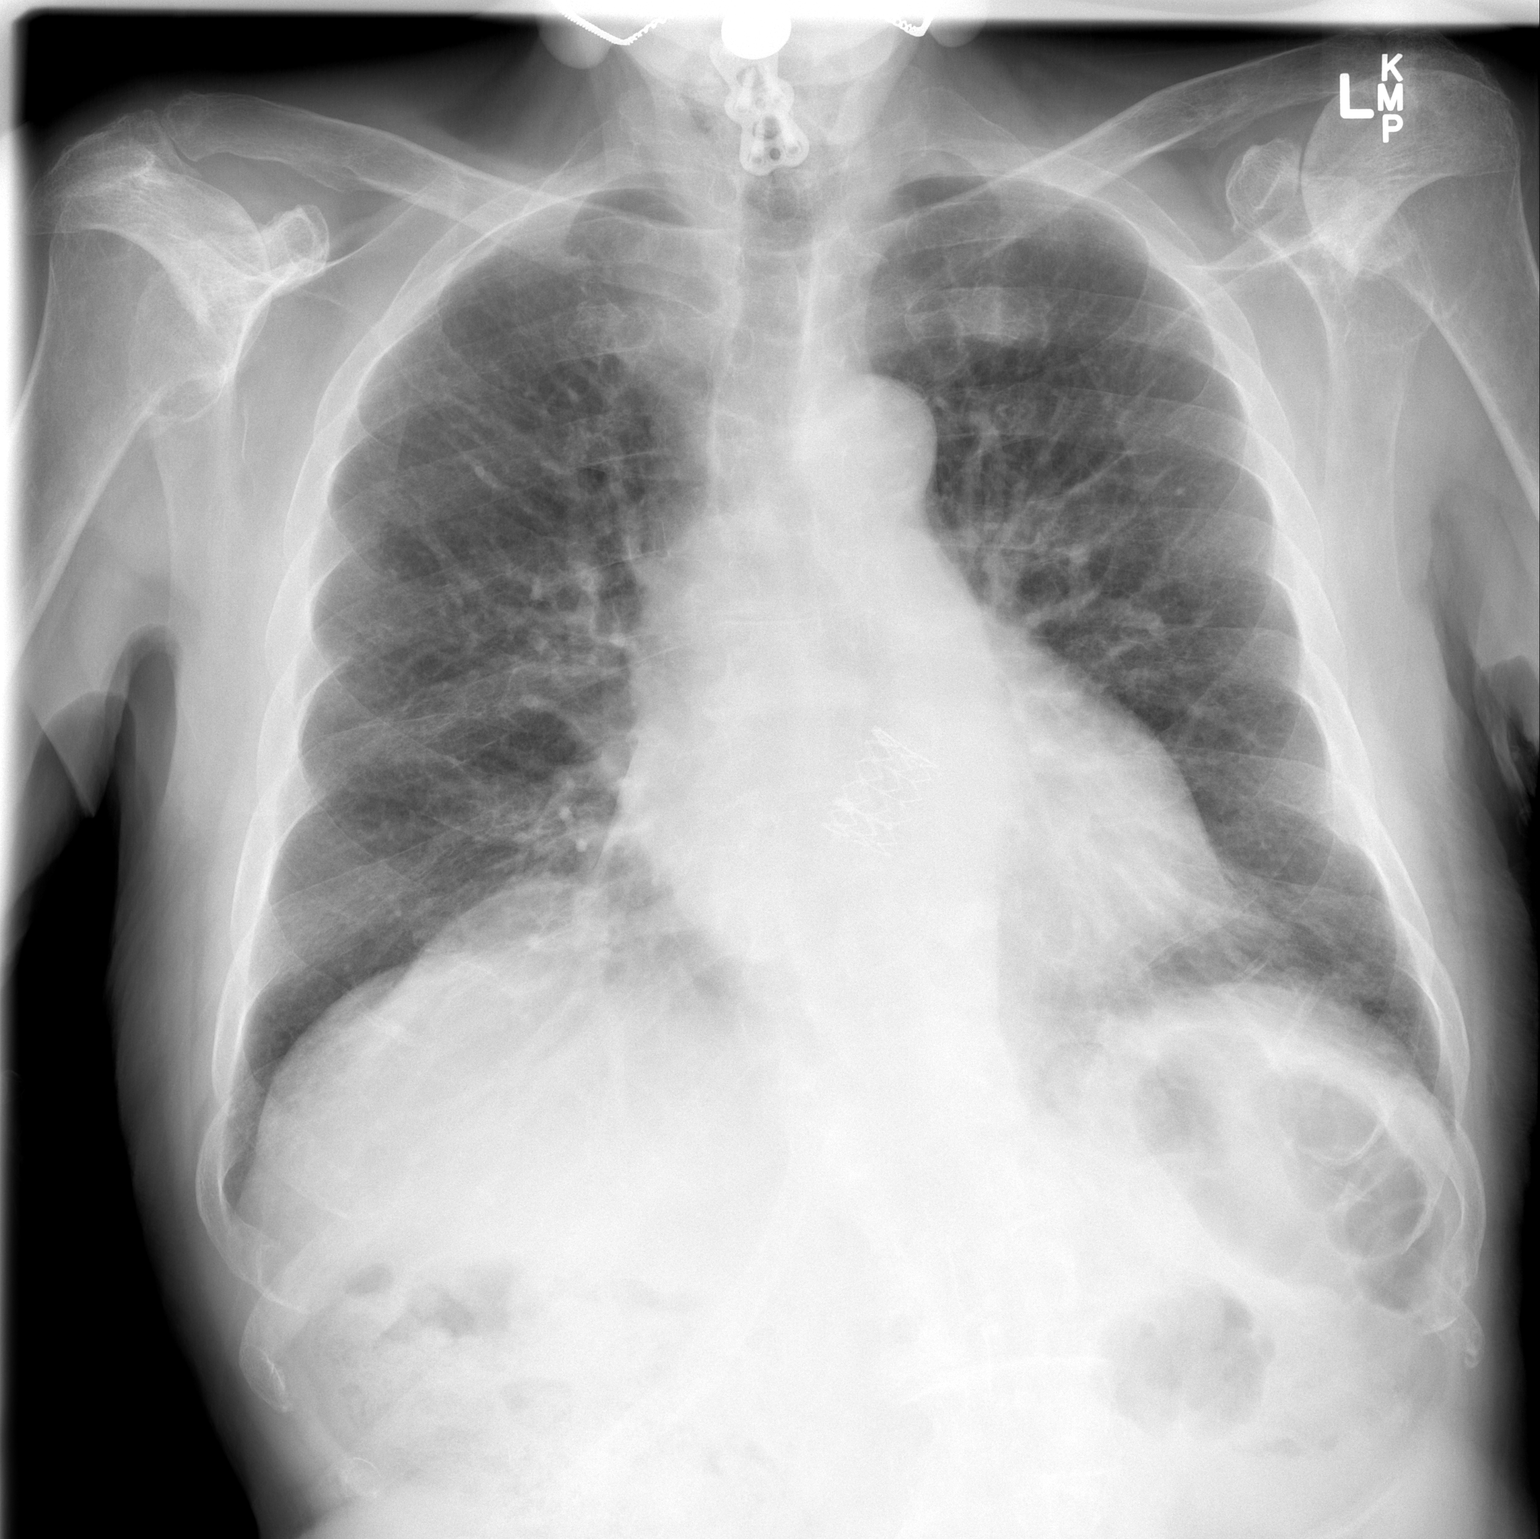

[w chest lat]
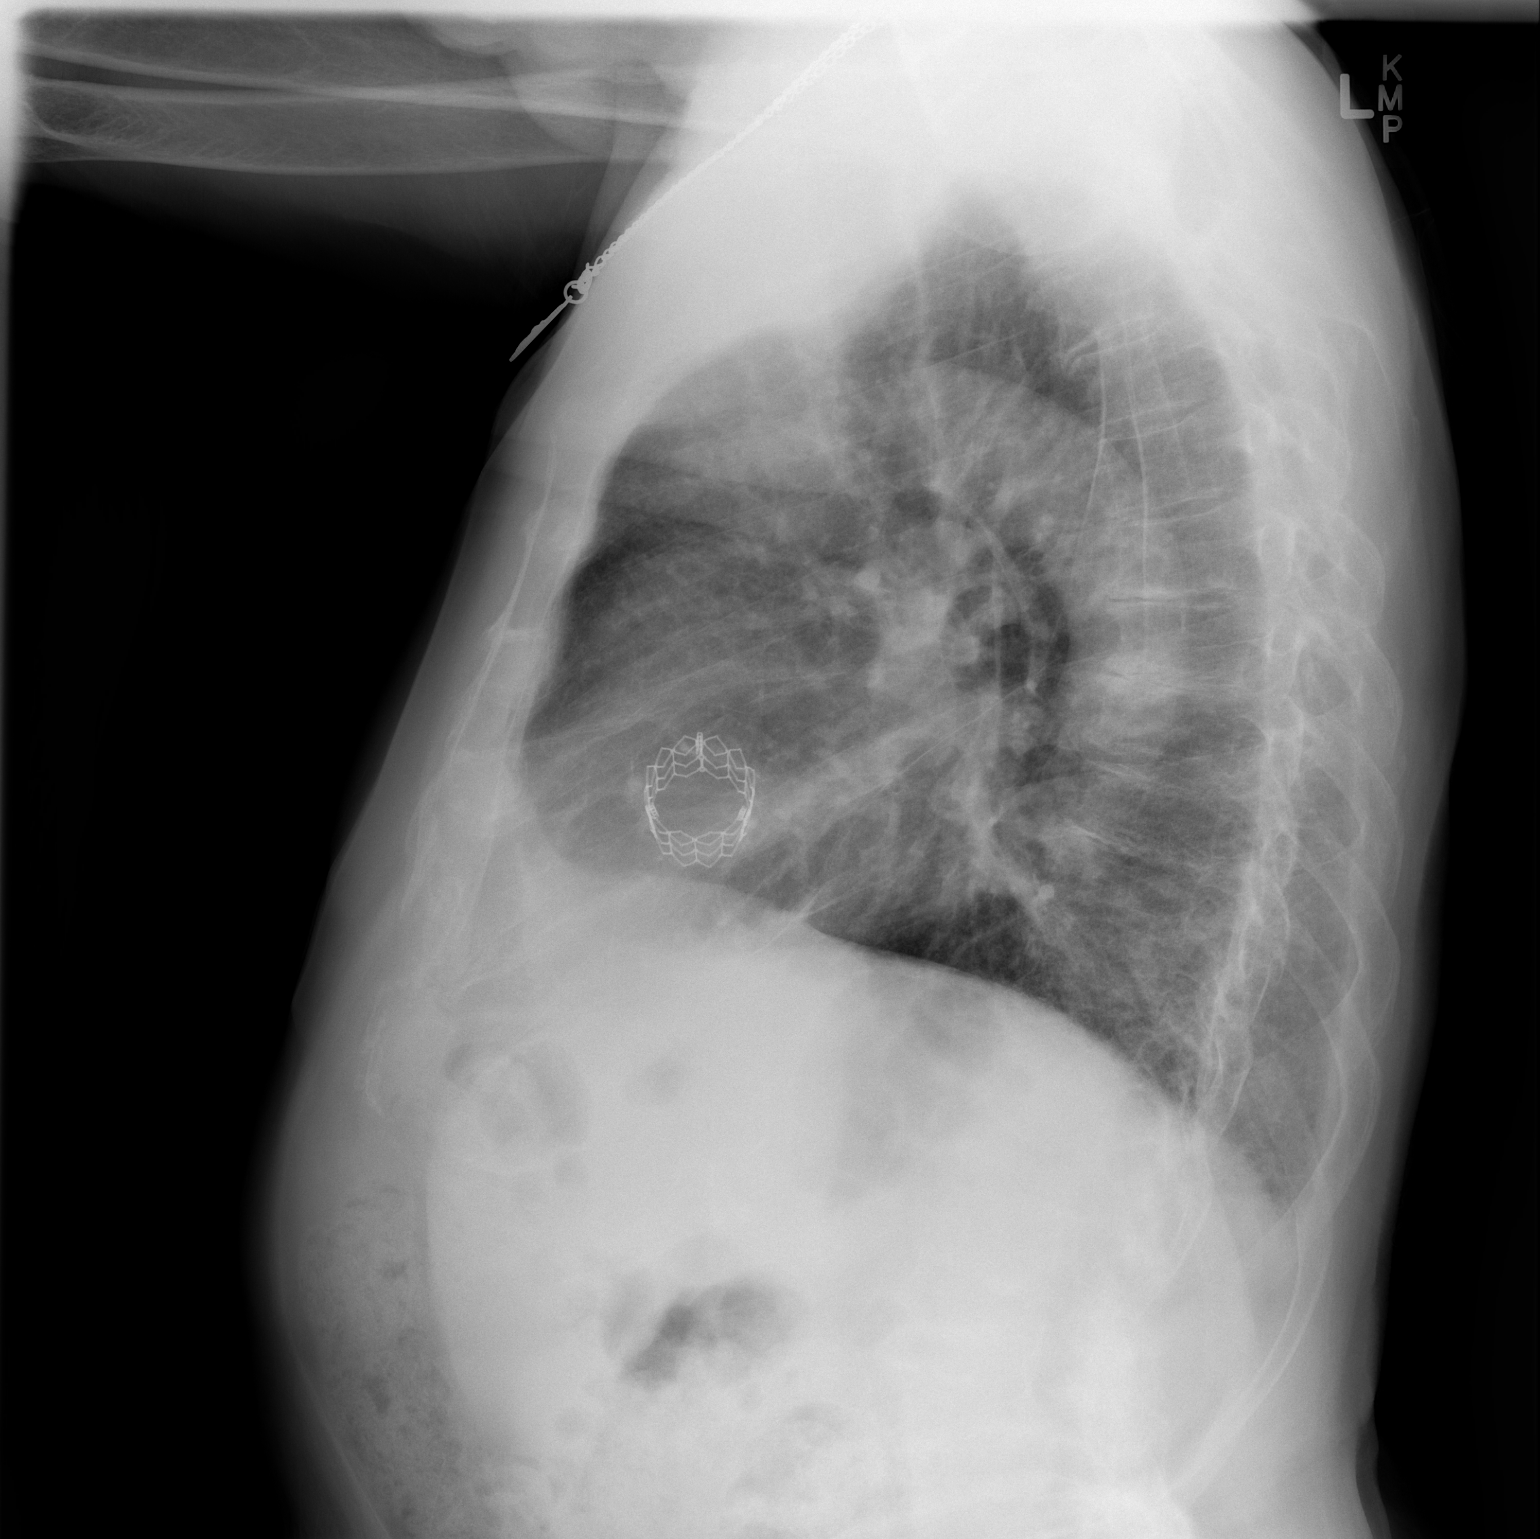

[2 of 2 positions shown; findings below may reference images not displayed]

FINDINGS: Enlargement of cardiac silhouette post TAVR.

With pulmonary vascular congestion.

Atherosclerotic calcification and elongation of thoracic aorta.

Interval removal of RIGHT jugular line.

Minimal bibasilar atelectasis.

No acute failure or consolidation.

No pleural effusion or pneumothorax.

Bones demineralized with BILATERAL glenohumeral degenerative changes
and question chronic rotator cuff tears.
IMPRESSION: Enlargement of cardiac silhouette with pulmonary vascular congestion
post TAVR.

Minimal bibasilar atelectasis.
